# Patient Record
Sex: Female | Born: 1950
Health system: Southern US, Community
[De-identification: ages and names within clinical notes are randomized; demographics above are authoritative.]

## PROBLEM LIST (undated history)

## (undated) DIAGNOSIS — E119 Type 2 diabetes mellitus without complications: Secondary | ICD-10-CM

## (undated) DIAGNOSIS — F419 Anxiety disorder, unspecified: Secondary | ICD-10-CM

## (undated) DIAGNOSIS — M1990 Unspecified osteoarthritis, unspecified site: Secondary | ICD-10-CM

## (undated) HISTORY — PX: WISDOM TOOTH EXTRACTION: SHX21

## (undated) HISTORY — PX: CATARACT EXTRACTION: SUR2

---

## 2006-03-10 ENCOUNTER — Encounter: Admission: RE | Admit: 2006-03-10 | Discharge: 2006-03-10 | Payer: Self-pay | Admitting: Orthopedic Surgery

## 2009-10-31 ENCOUNTER — Encounter: Admission: RE | Admit: 2009-10-31 | Discharge: 2009-12-06 | Payer: Self-pay | Admitting: Orthopedic Surgery

## 2014-03-29 ENCOUNTER — Other Ambulatory Visit (HOSPITAL_COMMUNITY)
Admission: RE | Admit: 2014-03-29 | Discharge: 2014-03-29 | Disposition: A | Discharge status: Home / Self Care | Payer: 59 | Source: Ambulatory Visit | Attending: Family Medicine | Admitting: Family Medicine

## 2014-03-29 ENCOUNTER — Other Ambulatory Visit: Payer: Self-pay | Admitting: Family Medicine

## 2014-03-29 DIAGNOSIS — Z01419 Encounter for gynecological examination (general) (routine) without abnormal findings: Secondary | ICD-10-CM | POA: Insufficient documentation

## 2014-03-29 DIAGNOSIS — Z1151 Encounter for screening for human papillomavirus (HPV): Secondary | ICD-10-CM | POA: Diagnosis present

## 2014-03-30 LAB — CYTOLOGY - PAP

## 2014-06-21 ENCOUNTER — Encounter: Payer: Self-pay | Admitting: Family Medicine

## 2015-01-12 ENCOUNTER — Ambulatory Visit (INDEPENDENT_AMBULATORY_CARE_PROVIDER_SITE_OTHER): Payer: 59 | Admitting: Family Medicine

## 2015-01-12 ENCOUNTER — Ambulatory Visit (INDEPENDENT_AMBULATORY_CARE_PROVIDER_SITE_OTHER): Payer: 59

## 2015-01-12 VITALS — BP 137/78 | HR 95 | Temp 98.7°F | Resp 20 | Ht 63.5 in | Wt 219.4 lb

## 2015-01-12 DIAGNOSIS — R062 Wheezing: Secondary | ICD-10-CM | POA: Diagnosis not present

## 2015-01-12 DIAGNOSIS — R05 Cough: Secondary | ICD-10-CM

## 2015-01-12 DIAGNOSIS — F17200 Nicotine dependence, unspecified, uncomplicated: Secondary | ICD-10-CM

## 2015-01-12 DIAGNOSIS — R058 Other specified cough: Secondary | ICD-10-CM

## 2015-01-12 DIAGNOSIS — J209 Acute bronchitis, unspecified: Secondary | ICD-10-CM | POA: Diagnosis not present

## 2015-01-12 DIAGNOSIS — Z720 Tobacco use: Secondary | ICD-10-CM | POA: Diagnosis not present

## 2015-01-12 DIAGNOSIS — E669 Obesity, unspecified: Secondary | ICD-10-CM | POA: Insufficient documentation

## 2015-01-12 DIAGNOSIS — IMO0001 Reserved for inherently not codable concepts without codable children: Secondary | ICD-10-CM | POA: Insufficient documentation

## 2015-01-12 MED ORDER — IPRATROPIUM BROMIDE 0.02 % IN SOLN
0.5000 mg | Freq: Once | RESPIRATORY_TRACT | Status: AC
  Administered 2015-01-12: 0.5 mg via RESPIRATORY_TRACT

## 2015-01-12 MED ORDER — PREDNISONE 20 MG PO TABS: ORAL_TABLET | ORAL | Status: DC

## 2015-01-12 MED ORDER — DOXYCYCLINE HYCLATE 100 MG PO CAPS: 100.0000 mg | ORAL_CAPSULE | Freq: Two times a day (BID) | ORAL | Status: DC

## 2015-01-12 MED ORDER — ALBUTEROL SULFATE HFA 108 (90 BASE) MCG/ACT IN AERS: 2.0000 | INHALATION_SPRAY | Freq: Four times a day (QID) | RESPIRATORY_TRACT | Status: DC | PRN

## 2015-01-12 MED ORDER — ALBUTEROL SULFATE (2.5 MG/3ML) 0.083% IN NEBU
2.5000 mg | INHALATION_SOLUTION | Freq: Once | RESPIRATORY_TRACT | Status: AC
  Administered 2015-01-12: 2.5 mg via RESPIRATORY_TRACT

## 2015-01-12 NOTE — Progress Notes (Signed)
[facility] [address] [place] [address] [phone]  Date:  01/12/2015   Name:  Melissa Jimenez   DOB:  [birth year]   MRN:  [id number]  PCP:  No primary care provider on file.    Chief Complaint: Cough; Nasal Congestion; and Shortness of Breath   History of Present Illness:  Melissa Jimenez is a 64 y.o. very pleasant female patient who presents with the following:  She notes a "terrible cough, a lot of congestion."  She has had sx for about one week.  She is getting worse.   She gets out of breath if she tries to walk.  She normally does not have any problems with walking or other activities although she has smoked for a long time.   She has not noted a fever  She has noted some aches/ chills.    The cough can be productive She has noted some wheezing No GI symptoms Sh had noted a headache- this is better today She has tried various OTC medications   There are no active problems to display for this patient.   History reviewed. No pertinent past medical history.  History reviewed. No pertinent past surgical history.  Social History  Substance Use Topics  . Smoking status: Current Every Day Smoker -- 0.50 packs/day    Types: Cigarettes  . Smokeless tobacco: Never Used  . Alcohol Use: No    Family History  Problem Relation Age of Onset  . Cancer Mother   . Cancer Father   . Cancer Sister   . Hyperlipidemia Daughter     No Known Allergies  Medication list has been reviewed and updated.  No current outpatient prescriptions on file prior to visit.   No current facility-administered medications on file prior to visit.    Review of Systems:  As per HPI- otherwise negative.    Physical Examination: Filed Vitals:   01/12/15 2013  BP: 137/78  Pulse: 99  Temp: 98.7 F (37.1 C)  Resp: 20   Filed Vitals:   01/12/15 2013  Height: 5' 3.5" (1.613 m)  Weight: 219 lb 6 oz (99.508 kg)   Body mass index is 38.25  kg/(m^2). Ideal Body Weight: Weight in (lb) to have BMI = 25: 143.1  GEN: WDWN, NAD, Non-toxic, A & O x 3, obese HEENT: Atraumatic, Normocephalic. Neck supple. No masses, No LAD.  Bilateral TM wnl, oropharynx normal.  PEERL,EOMI.   Ears and Nose: No external deformity. CV: RRR, No M/G/R. No JVD. No thrill. No extra heart sounds. PULM:  No retractions. No resp. distress. No accessory muscle use. ABD: S, NT, ND, +BS. No rebound. No HSM. EXTR: No c/c/e NEURO Normal gait.  PSYCH: Normally interactive. Conversant. Not depressed or anxious appearing.  Calm demeanor.  She is quite tight, not moving much air at all and wheezing on initial lung exam.   Given duoneb: this did help her move more air and cleared wheezing  [facility] reading (PRIMARY) by  Dr. Lorelei Pont. CXR: no definite infiltrate   CHEST 2 VIEW  COMPARISON: None.  FINDINGS: Normal heart size and pulmonary vascularity. No focal airspace disease or consolidation in the lungs. No blunting of costophrenic angles. No pneumothorax. Mediastinal contours appear intact. Mild hyperinflation suggesting emphysema. Degenerative changes in the spine and shoulders. Calcification of the aorta. Old right rib fractures.  IMPRESSION: No active cardiopulmonary disease.  Assessment and Plan: Acute bronchitis, unspecified organism - Plan: predniSONE (DELTASONE) 20 MG tablet, doxycycline (VIBRAMYCIN)  100 MG capsule  Wheezing - Plan: albuterol (PROVENTIL) (2.5 MG/3ML) 0.083% nebulizer solution 2.5 mg, ipratropium (ATROVENT) nebulizer solution 0.5 mg, DG Chest 2 View, albuterol (PROVENTIL HFA;VENTOLIN HFA) 108 (90 BASE) MCG/ACT inhaler  Productive cough - Plan: DG Chest 2 View  Tobacco abuse  Treat today for bronchitis, ?COPD exacerbation and wheezing as above Encouraged her to come in for spirometry when she is well, and to quit smoking asap.  She will think about this Close follow-up if not better   Signed Lamar Blinks, MD

## 2015-01-12 NOTE — Patient Instructions (Signed)
We are going to treat you for bronchitis and wheezing.  Use the albuterol inhaler as needed for wheezing and cough Use the doxycycline antibiotic as directed Use the prednisone (steroid pills) as directed   You may have some COPD from smoking for a long time Please come and see [place] when you are well so we can do a breathing test for you  If your are not doing better over the next few days please let me know- Sooner if worse.   If you get acutely worse go to the ER Please think about quitting smoking now!!  Your lungs will likely get worse if you continue to smoke It would also be a very good idea to get a physical and labs soon- I am glad to see you for this if you like

## 2015-11-28 DIAGNOSIS — J209 Acute bronchitis, unspecified: Secondary | ICD-10-CM | POA: Diagnosis not present

## 2015-11-28 DIAGNOSIS — R05 Cough: Secondary | ICD-10-CM | POA: Diagnosis not present

## 2015-11-28 DIAGNOSIS — J9801 Acute bronchospasm: Secondary | ICD-10-CM | POA: Diagnosis not present

## 2015-11-28 MED FILL — VENTOLIN HFA 90 MCG INHALER: 108 (90 BAS | 16 days supply | Qty: 18 | Fill #0

## 2015-11-28 MED FILL — BENZONATATE 100 MG CAPSULE: 100 | 7 days supply | Qty: 21 | Fill #0

## 2015-11-28 MED FILL — AMOXICILLIN 875 MG TABLET: 875 | 10 days supply | Qty: 20 | Fill #0

## 2015-11-28 MED FILL — METHYLPREDNISOLONE 4 MG TAB: 4 | 6 days supply | Qty: 21 | Fill #0

## 2015-12-05 DIAGNOSIS — H04123 Dry eye syndrome of bilateral lacrimal glands: Secondary | ICD-10-CM | POA: Diagnosis not present

## 2015-12-05 DIAGNOSIS — H25043 Posterior subcapsular polar age-related cataract, bilateral: Secondary | ICD-10-CM | POA: Diagnosis not present

## 2015-12-05 MED FILL — LOTEMAX 0.5% GEL: 0.5 | 20 days supply | Qty: 5 | Fill #0

## 2015-12-22 DIAGNOSIS — H2513 Age-related nuclear cataract, bilateral: Secondary | ICD-10-CM | POA: Diagnosis not present

## 2015-12-22 DIAGNOSIS — H04123 Dry eye syndrome of bilateral lacrimal glands: Secondary | ICD-10-CM | POA: Diagnosis not present

## 2015-12-22 MED FILL — RESTASIS MULTIDOSE 0.05% EY: 0.05 | 27 days supply | Qty: 6 | Fill #0

## 2016-12-10 DIAGNOSIS — H2513 Age-related nuclear cataract, bilateral: Secondary | ICD-10-CM | POA: Diagnosis not present

## 2016-12-30 DIAGNOSIS — H31092 Other chorioretinal scars, left eye: Secondary | ICD-10-CM | POA: Diagnosis not present

## 2016-12-30 DIAGNOSIS — H2513 Age-related nuclear cataract, bilateral: Secondary | ICD-10-CM | POA: Diagnosis not present

## 2017-03-13 DIAGNOSIS — H2511 Age-related nuclear cataract, right eye: Secondary | ICD-10-CM | POA: Diagnosis not present

## 2017-03-13 DIAGNOSIS — H2512 Age-related nuclear cataract, left eye: Secondary | ICD-10-CM | POA: Diagnosis not present

## 2017-03-24 DIAGNOSIS — H2511 Age-related nuclear cataract, right eye: Secondary | ICD-10-CM | POA: Diagnosis not present

## 2017-03-27 DIAGNOSIS — H2511 Age-related nuclear cataract, right eye: Secondary | ICD-10-CM | POA: Diagnosis not present

## 2017-09-05 ENCOUNTER — Ambulatory Visit (INDEPENDENT_AMBULATORY_CARE_PROVIDER_SITE_OTHER): Payer: Self-pay | Admitting: Family Medicine

## 2017-09-05 VITALS — BP 126/90 | HR 88 | Temp 98.2°F | Resp 17 | Ht 63.5 in | Wt 222.0 lb

## 2017-09-05 DIAGNOSIS — Z0000 Encounter for general adult medical examination without abnormal findings: Secondary | ICD-10-CM

## 2017-09-05 DIAGNOSIS — R600 Localized edema: Secondary | ICD-10-CM

## 2017-09-05 NOTE — Patient Instructions (Signed)

## 2017-09-05 NOTE — Progress Notes (Signed)
Melissa Jimenez is a 67 y.o. female who presents today with concerns of need for an annual physical exam. She denies any chronic health conditions and is under the care of Dr. Justin Mend at [facility] for regular and routine health care.   Review of Systems  Constitutional: Negative for chills, fever and malaise/fatigue.  HENT: Negative for congestion, ear discharge, ear pain, sinus pain and sore throat.   Eyes: Negative.   Respiratory: Negative for cough, sputum production and shortness of breath.   Cardiovascular: Negative.  Negative for chest pain.  Gastrointestinal: Negative for abdominal pain, diarrhea, nausea and vomiting.  Genitourinary: Negative for dysuria, frequency, hematuria and urgency.  Musculoskeletal: Negative for myalgias.  Skin: Negative.   Neurological: Negative for headaches.  Endo/Heme/Allergies: Negative.   Psychiatric/Behavioral: Negative.     O: Vitals:   09/05/17 1024  BP: 126/90  Pulse: 88  Resp: 17  Temp: 98.2 F (36.8 C)  SpO2: 96%     Physical Exam  Constitutional: She is oriented to person, place, and time. Vital signs are normal. She appears well-developed and well-nourished. She is active.  Non-toxic appearance. She does not have a sickly appearance.  HENT:  Head: Normocephalic.  Right Ear: Hearing, tympanic membrane, external ear and ear canal normal.  Left Ear: Hearing, tympanic membrane, external ear and ear canal normal.  Nose: Nose normal.  Mouth/Throat: Uvula is midline and oropharynx is clear and moist.  Neck: Normal range of motion. Neck supple.  Cardiovascular: Normal rate, regular rhythm, normal heart sounds and normal pulses.  Pulmonary/Chest: Effort normal and breath sounds normal.  Abdominal: Soft. Bowel sounds are normal.  Musculoskeletal: Normal range of motion.       Right ankle: She exhibits swelling.       Left ankle: She exhibits swelling.  Bilateral lower extremity edema + 2 pitting- skin is intact and appropriate to exam   Lymphadenopathy:       Head (right side): No submental and no submandibular adenopathy present.       Head (left side): No submental and no submandibular adenopathy present.    She has no cervical adenopathy.  Neurological: She is alert and oriented to person, place, and time.  Psychiatric: She has a normal mood and affect. Her speech is normal and behavior is normal. Cognition and memory are normal.  PHQ-9- negative  Vitals reviewed.  A: 1. Physical exam    P: Exam findings, diagnosis etiology and medication use and indications reviewed with patient. Follow- Up and discharge instructions provided. No emergent/urgent issues found on exam.  Patient verbalized understanding of information provided and agrees with plan of care (POC), all questions answered.  1. Physical exam WNL- completed  2. Edema- discussed evening elevation, and ted hose- ultimately advised to f/u with [facility] for comprehensive evaluation

## 2019-11-10 ENCOUNTER — Other Ambulatory Visit (HOSPITAL_COMMUNITY): Payer: Self-pay | Admitting: Internal Medicine

## 2019-11-17 ENCOUNTER — Other Ambulatory Visit (HOSPITAL_COMMUNITY): Payer: Self-pay | Admitting: Internal Medicine

## 2019-11-17 DIAGNOSIS — Z720 Tobacco use: Secondary | ICD-10-CM | POA: Diagnosis not present

## 2019-11-17 DIAGNOSIS — E2839 Other primary ovarian failure: Secondary | ICD-10-CM | POA: Diagnosis not present

## 2019-11-17 DIAGNOSIS — Z0000 Encounter for general adult medical examination without abnormal findings: Secondary | ICD-10-CM | POA: Diagnosis not present

## 2019-11-17 DIAGNOSIS — Z13220 Encounter for screening for lipoid disorders: Secondary | ICD-10-CM | POA: Diagnosis not present

## 2019-11-17 DIAGNOSIS — Z1211 Encounter for screening for malignant neoplasm of colon: Secondary | ICD-10-CM | POA: Diagnosis not present

## 2019-11-19 ENCOUNTER — Other Ambulatory Visit: Payer: Self-pay | Admitting: Family Medicine

## 2019-11-19 DIAGNOSIS — E2839 Other primary ovarian failure: Secondary | ICD-10-CM

## 2019-11-19 DIAGNOSIS — Z1231 Encounter for screening mammogram for malignant neoplasm of breast: Secondary | ICD-10-CM

## 2019-11-22 ENCOUNTER — Ambulatory Visit
Admission: RE | Admit: 2019-11-22 | Discharge: 2019-11-22 | Disposition: A | Discharge status: Home / Self Care | Payer: 59 | Source: Ambulatory Visit | Attending: Family Medicine | Admitting: Family Medicine

## 2019-11-22 ENCOUNTER — Other Ambulatory Visit: Payer: Self-pay

## 2019-11-22 DIAGNOSIS — Z1231 Encounter for screening mammogram for malignant neoplasm of breast: Secondary | ICD-10-CM | POA: Diagnosis not present

## 2019-11-24 ENCOUNTER — Ambulatory Visit
Admission: RE | Admit: 2019-11-24 | Discharge: 2019-11-24 | Disposition: A | Discharge status: Home / Self Care | Payer: 59 | Source: Ambulatory Visit | Attending: Family Medicine | Admitting: Family Medicine

## 2019-11-24 ENCOUNTER — Other Ambulatory Visit: Payer: Self-pay

## 2019-11-24 DIAGNOSIS — M85852 Other specified disorders of bone density and structure, left thigh: Secondary | ICD-10-CM | POA: Diagnosis not present

## 2019-11-24 DIAGNOSIS — Z780 Asymptomatic menopausal state: Secondary | ICD-10-CM | POA: Diagnosis not present

## 2019-11-24 DIAGNOSIS — Z1211 Encounter for screening for malignant neoplasm of colon: Secondary | ICD-10-CM | POA: Diagnosis not present

## 2019-11-24 DIAGNOSIS — E2839 Other primary ovarian failure: Secondary | ICD-10-CM

## 2019-11-26 ENCOUNTER — Other Ambulatory Visit (HOSPITAL_COMMUNITY): Payer: Self-pay | Admitting: Family Medicine

## 2019-11-26 MED FILL — ROSUVASTATIN CALCIUM 5 MG T: 5 | 90 days supply | Qty: 90 | Fill #0

## 2019-11-26 MED FILL — SHINGRIX 50 MCG SUS: 50 | 1 days supply | Qty: 1 | Fill #0

## 2020-01-07 DIAGNOSIS — E782 Mixed hyperlipidemia: Secondary | ICD-10-CM | POA: Diagnosis not present

## 2020-01-07 DIAGNOSIS — Z131 Encounter for screening for diabetes mellitus: Secondary | ICD-10-CM | POA: Diagnosis not present

## 2020-01-17 ENCOUNTER — Other Ambulatory Visit (HOSPITAL_COMMUNITY): Payer: Self-pay | Admitting: Family Medicine

## 2020-01-17 DIAGNOSIS — Z7984 Long term (current) use of oral hypoglycemic drugs: Secondary | ICD-10-CM | POA: Diagnosis not present

## 2020-01-17 DIAGNOSIS — E119 Type 2 diabetes mellitus without complications: Secondary | ICD-10-CM | POA: Diagnosis not present

## 2020-01-17 MED FILL — metFORMIN HCL ER 500 MG TB2: 500 | 90 days supply | Qty: 180 | Fill #0

## 2020-01-17 MED FILL — FREESTYLE LITE METER: W/DEVICE | 20 days supply | Qty: 1 | Fill #0

## 2020-01-17 MED FILL — FREESTYLE LANCETS: 90 days supply | Qty: 200 | Fill #0

## 2020-01-17 MED FILL — FREESTYLE LITE TEST STRIP: 66 days supply | Qty: 200 | Fill #0

## 2020-01-21 MED FILL — SHINGRIX 50 MCG SUS: 50 | 1 days supply | Qty: 1 | Fill #1

## 2020-02-07 MED FILL — PNEUMOVAX 23 SYRINGE: 25 | 1 days supply | Qty: 1 | Fill #0

## 2020-02-28 MED FILL — ROSUVASTATIN CALCIUM 5 MG T: 5 | 90 days supply | Qty: 90 | Fill #1

## 2020-04-20 MED FILL — metFORMIN HCL ER 500 MG TB2: 500 | 90 days supply | Qty: 180 | Fill #1

## 2020-05-16 ENCOUNTER — Other Ambulatory Visit (HOSPITAL_COMMUNITY): Payer: Self-pay | Admitting: Family Medicine

## 2020-05-16 DIAGNOSIS — B351 Tinea unguium: Secondary | ICD-10-CM | POA: Diagnosis not present

## 2020-05-16 DIAGNOSIS — E119 Type 2 diabetes mellitus without complications: Secondary | ICD-10-CM | POA: Diagnosis not present

## 2020-05-16 DIAGNOSIS — Z720 Tobacco use: Secondary | ICD-10-CM | POA: Diagnosis not present

## 2020-05-16 DIAGNOSIS — E782 Mixed hyperlipidemia: Secondary | ICD-10-CM | POA: Diagnosis not present

## 2020-05-16 MED FILL — ROSUVASTATIN CALCIUM 5 MG T: 5 | 90 days supply | Qty: 90 | Fill #0

## 2020-05-25 ENCOUNTER — Ambulatory Visit: Payer: PPO | Admitting: Podiatry

## 2020-05-25 ENCOUNTER — Other Ambulatory Visit: Payer: Self-pay

## 2020-05-25 ENCOUNTER — Other Ambulatory Visit: Payer: Self-pay | Admitting: Podiatry

## 2020-05-25 DIAGNOSIS — B351 Tinea unguium: Secondary | ICD-10-CM

## 2020-05-25 MED ORDER — TERBINAFINE HCL 250 MG PO TABS: 250.0000 mg | ORAL_TABLET | Freq: Every day | ORAL | 0 refills | Status: DC

## 2020-05-25 MED ORDER — CICLOPIROX 8 % EX SOLN: Freq: Every day | CUTANEOUS | 3 refills | Status: DC

## 2020-05-25 NOTE — Patient Instructions (Signed)
Fungal Nail Infection A fungal nail infection is a common infection of the toenails or fingernails. This condition affects toenails more often than fingernails. It often affects the great, or big, toes. More than one nail may be infected. The condition can be passed from person to person (is contagious). What are the causes? This condition is caused by a fungus. Several types of fungi can cause the infection. These fungi are common in moist and warm areas. If your hands or feet come into contact with the fungus, it may get into a crack in your fingernail or toenail and cause the infection. What increases the risk? The following factors may make you more likely to develop this condition:  Being female.  Being of older age.  Living with someone who has the fungus.  Walking barefoot in areas where the fungus thrives, such as showers or locker rooms.  Wearing shoes and socks that cause your feet to sweat.  Having a nail injury or a recent nail surgery.  Having certain medical conditions, such as: ? Athlete's foot. ? Diabetes. ? Psoriasis. ? Poor circulation. ? A weak body defense system (immune system). What are the signs or symptoms? Symptoms of this condition include:  A pale spot on the nail.  Thickening of the nail.  A nail that becomes yellow or brown.  A brittle or ragged nail edge.  A crumbling nail.  A nail that has lifted away from the nail bed.   How is this diagnosed? This condition is diagnosed with a physical exam. Your health care provider may take a scraping or clipping from your nail to test for the fungus. How is this treated? Treatment is not needed for mild infections. If you have significant nail changes, treatment may include:  Antifungal medicines taken by mouth (orally). You may need to take the medicine for several weeks or several months, and you may not see the results for a long time. These medicines can cause side effects. Ask your health care  provider what problems to watch for.  Antifungal nail polish or nail cream. These may be used along with oral antifungal medicines.  Laser treatment of the nail.  Surgery to remove the nail. This may be needed for the most severe infections. It can take a long time, usually up to a year, for the infection to go away. The infection may also come back.   Follow these instructions at home: Medicines  Take or apply over-the-counter and prescription medicines only as told by your health care provider.  Ask your health care provider about using over-the-counter mentholated ointment on your nails. Nail care  Trim your nails often.  Wash and dry your hands and feet every day.  Keep your feet dry: ? Wear absorbent socks, and change your socks frequently. ? Wear shoes that allow air to circulate, such as sandals or canvas tennis shoes. Throw out old shoes.  Do not use artificial nails.  If you go to a nail salon, make sure you choose one that uses clean instruments.  Use antifungal foot powder on your feet and in your shoes. General instructions  Do not share personal items, such as towels or nail clippers.  Do not walk barefoot in shower rooms or locker rooms.  Wear rubber gloves if you are working with your hands in wet areas.  Keep all follow-up visits as told by your health care provider. This is important. Contact a health care provider if: Your infection is not getting better or   it is getting worse after several months. Summary  A fungal nail infection is a common infection of the toenails or fingernails.  Treatment is not needed for mild infections. If you have significant nail changes, treatment may include taking medicine orally and applying medicine to your nails.  It can take a long time, usually up to a year, for the infection to go away. The infection may also come back.  Take or apply over-the-counter and prescription medicines only as told by your health care  provider.  Follow instructions for taking care of your nails to help prevent infection from coming back or spreading. This information is not intended to replace advice given to you by your health care provider. Make sure you discuss any questions you have with your health care provider. Document Revised: 06/18/2018 Document Reviewed: 08/01/2017 Elsevier Patient Education  2021 Elsevier Inc.  

## 2020-05-25 NOTE — Progress Notes (Signed)
  Subjective:  Patient ID: Melissa Jimenez, female    DOB: 1950-11-19,  MRN: [id number]  Chief Complaint  Patient presents with  . Nail Problem    Nail discoloration  PT stated that she has tried different medications     70 y.o. female presents with the above complaint. History confirmed with patient.  She has tried multiple over-the-counter treatments before without success  Objective:  Physical Exam:  warm, good capillary refill, no trophic changes or ulcerative lesions, normal DP and PT pulses and normal sensory exam.  Onychomycosis of all nails with yellow discoloration, thickening the nail plates and dystrophic subungual debris     Assessment:   1. Onychomycosis      Plan:  Patient was evaluated and treated and all questions answered.  Discussed the etiology and treatment options for the condition in detail with the patient. Educated patient on the topical and oral treatment options for mycotic nails. Recommended topical and oral treatment combination therapy with Lamisil and ciclopirox.  She has no history of liver disease and does not drink heavily.  Follow-up in 4 months to reevaluate.  Photographs were taken.  We will consider laser therapy in the future if not improving  Return in about 4 months (around 09/24/2020) for follow up on nail fungus.

## 2020-05-29 ENCOUNTER — Other Ambulatory Visit (HOSPITAL_COMMUNITY): Payer: Self-pay | Admitting: Family Medicine

## 2020-05-29 MED FILL — OZEMPIC 0.25 OR 0.5 MG/DOSE: 2 | 70 days supply | Qty: 3 | Fill #0

## 2020-06-26 ENCOUNTER — Other Ambulatory Visit (HOSPITAL_COMMUNITY): Payer: Self-pay

## 2020-06-26 MED FILL — Ciclopirox Solution 8%: CUTANEOUS | 30 days supply | Qty: 6.6 | Fill #0 | Status: AC

## 2020-07-24 ENCOUNTER — Other Ambulatory Visit (HOSPITAL_COMMUNITY): Payer: Self-pay

## 2020-07-24 MED ORDER — METFORMIN HCL ER 500 MG PO TB24
ORAL_TABLET | ORAL | 1 refills | Status: DC
  Filled 2020-07-24: qty 180, 90d supply, fill #0
  Filled 2020-10-21: qty 180, 90d supply, fill #1

## 2020-08-09 ENCOUNTER — Other Ambulatory Visit (HOSPITAL_COMMUNITY): Payer: Self-pay

## 2020-08-09 MED FILL — Ciclopirox Solution 8%: CUTANEOUS | 30 days supply | Qty: 6.6 | Fill #1 | Status: AC

## 2020-08-26 ENCOUNTER — Other Ambulatory Visit (HOSPITAL_COMMUNITY): Payer: Self-pay

## 2020-08-26 MED FILL — Rosuvastatin Calcium Tab 5 MG: ORAL | 90 days supply | Qty: 90 | Fill #0 | Status: AC

## 2020-08-29 DIAGNOSIS — E119 Type 2 diabetes mellitus without complications: Secondary | ICD-10-CM | POA: Diagnosis not present

## 2020-09-19 ENCOUNTER — Other Ambulatory Visit (HOSPITAL_COMMUNITY): Payer: Self-pay

## 2020-09-19 MED FILL — Ciclopirox Solution 8%: CUTANEOUS | 30 days supply | Qty: 6.6 | Fill #2 | Status: AC

## 2020-09-20 ENCOUNTER — Other Ambulatory Visit (HOSPITAL_COMMUNITY): Payer: Self-pay

## 2020-09-25 ENCOUNTER — Other Ambulatory Visit (HOSPITAL_COMMUNITY): Payer: Self-pay

## 2020-09-28 ENCOUNTER — Ambulatory Visit: Payer: PPO | Admitting: Podiatry

## 2020-10-05 ENCOUNTER — Ambulatory Visit: Payer: PPO | Admitting: Podiatry

## 2020-10-05 ENCOUNTER — Other Ambulatory Visit (HOSPITAL_COMMUNITY): Payer: Self-pay

## 2020-10-05 ENCOUNTER — Other Ambulatory Visit: Payer: Self-pay

## 2020-10-05 DIAGNOSIS — B351 Tinea unguium: Secondary | ICD-10-CM | POA: Diagnosis not present

## 2020-10-05 MED ORDER — FLUCONAZOLE 150 MG PO TABS
150.0000 mg | ORAL_TABLET | Freq: Once | ORAL | 0 refills | Status: DC
  Filled 2020-10-05: qty 1, 1d supply, fill #0

## 2020-10-08 ENCOUNTER — Encounter: Payer: Self-pay | Admitting: Podiatry

## 2020-10-08 MED ORDER — FLUCONAZOLE 150 MG PO TABS
150.0000 mg | ORAL_TABLET | ORAL | 0 refills | Status: DC
  Filled 2020-10-08: qty 12, 84d supply, fill #0
  Filled 2021-01-03: qty 12, 84d supply, fill #1
  Filled 2021-04-05 – 2021-04-06 (×2): qty 2, 14d supply, fill #2

## 2020-10-08 NOTE — Progress Notes (Signed)
  Subjective:  Patient ID: Melissa Jimenez, female    DOB: [birth year],  MRN: [id number]  Chief Complaint  Patient presents with   Nail Problem      follow up on nail fungus    70 y.o. female presents with the above complaint. History confirmed with patient.  She feels like she has had some improvement  Objective:  Physical Exam:  warm, good capillary refill, no trophic changes or ulcerative lesions, normal DP and PT pulses and normal sensory exam.  Onychomycosis of all nails with yellow discoloration, thickening the nail plates and dystrophic subungual debris     Assessment:   1. Onychomycosis       Plan:  Patient was evaluated and treated and all questions answered.  She did have slight improvement with the Lamisil although it was not a huge difference.  We discussed further treatment with pulsed dosing of fluconazole.  76-monthcourse sent to her pharmacy.  She will follow-up with me at that time we will reevaluate.  If not significantly improved by that point likely will not have a good option for a mycotic cure  Return in about 6 months (around 04/07/2021).

## 2020-10-09 ENCOUNTER — Other Ambulatory Visit (HOSPITAL_COMMUNITY): Payer: Self-pay

## 2020-10-21 ENCOUNTER — Other Ambulatory Visit (HOSPITAL_COMMUNITY): Payer: Self-pay

## 2020-10-31 DIAGNOSIS — E119 Type 2 diabetes mellitus without complications: Secondary | ICD-10-CM | POA: Diagnosis not present

## 2020-11-14 ENCOUNTER — Other Ambulatory Visit: Payer: Self-pay | Admitting: Family Medicine

## 2020-11-14 DIAGNOSIS — Z1231 Encounter for screening mammogram for malignant neoplasm of breast: Secondary | ICD-10-CM

## 2020-11-15 DIAGNOSIS — E119 Type 2 diabetes mellitus without complications: Secondary | ICD-10-CM | POA: Diagnosis not present

## 2020-11-15 DIAGNOSIS — E782 Mixed hyperlipidemia: Secondary | ICD-10-CM | POA: Diagnosis not present

## 2020-11-17 DIAGNOSIS — Z1211 Encounter for screening for malignant neoplasm of colon: Secondary | ICD-10-CM | POA: Diagnosis not present

## 2020-11-17 DIAGNOSIS — Z23 Encounter for immunization: Secondary | ICD-10-CM | POA: Diagnosis not present

## 2020-11-17 DIAGNOSIS — Z720 Tobacco use: Secondary | ICD-10-CM | POA: Diagnosis not present

## 2020-11-17 DIAGNOSIS — E782 Mixed hyperlipidemia: Secondary | ICD-10-CM | POA: Diagnosis not present

## 2020-11-17 DIAGNOSIS — E119 Type 2 diabetes mellitus without complications: Secondary | ICD-10-CM | POA: Diagnosis not present

## 2020-11-17 DIAGNOSIS — M25511 Pain in right shoulder: Secondary | ICD-10-CM | POA: Diagnosis not present

## 2020-11-17 DIAGNOSIS — Z0000 Encounter for general adult medical examination without abnormal findings: Secondary | ICD-10-CM | POA: Diagnosis not present

## 2020-11-17 DIAGNOSIS — M25512 Pain in left shoulder: Secondary | ICD-10-CM | POA: Diagnosis not present

## 2020-11-21 DIAGNOSIS — M19011 Primary osteoarthritis, right shoulder: Secondary | ICD-10-CM | POA: Diagnosis not present

## 2020-11-21 DIAGNOSIS — M19012 Primary osteoarthritis, left shoulder: Secondary | ICD-10-CM | POA: Diagnosis not present

## 2020-11-21 DIAGNOSIS — M5030 Other cervical disc degeneration, unspecified cervical region: Secondary | ICD-10-CM | POA: Diagnosis not present

## 2020-11-23 DIAGNOSIS — Z1211 Encounter for screening for malignant neoplasm of colon: Secondary | ICD-10-CM | POA: Diagnosis not present

## 2020-11-24 ENCOUNTER — Other Ambulatory Visit (HOSPITAL_COMMUNITY): Payer: Self-pay

## 2020-11-27 ENCOUNTER — Other Ambulatory Visit (HOSPITAL_COMMUNITY): Payer: Self-pay

## 2020-11-27 MED ORDER — ROSUVASTATIN CALCIUM 5 MG PO TABS
ORAL_TABLET | ORAL | 1 refills | Status: DC
  Filled 2020-11-27: qty 90, 90d supply, fill #0
  Filled 2021-02-23: qty 90, 90d supply, fill #1

## 2020-11-28 ENCOUNTER — Other Ambulatory Visit (HOSPITAL_COMMUNITY): Payer: Self-pay

## 2020-11-28 DIAGNOSIS — M19012 Primary osteoarthritis, left shoulder: Secondary | ICD-10-CM | POA: Diagnosis not present

## 2020-12-19 DIAGNOSIS — M19012 Primary osteoarthritis, left shoulder: Secondary | ICD-10-CM | POA: Diagnosis not present

## 2020-12-21 ENCOUNTER — Ambulatory Visit: Payer: 59

## 2021-01-03 ENCOUNTER — Other Ambulatory Visit (HOSPITAL_COMMUNITY): Payer: Self-pay

## 2021-01-03 MED FILL — Lancets: 90 days supply | Qty: 200 | Fill #0 | Status: AC

## 2021-01-03 MED FILL — Glucose Blood Test Strip: 67 days supply | Qty: 200 | Fill #0 | Status: AC

## 2021-01-18 ENCOUNTER — Other Ambulatory Visit (HOSPITAL_COMMUNITY): Payer: Self-pay

## 2021-01-19 ENCOUNTER — Other Ambulatory Visit (HOSPITAL_COMMUNITY): Payer: Self-pay

## 2021-01-19 MED ORDER — METFORMIN HCL ER 500 MG PO TB24
ORAL_TABLET | ORAL | 3 refills | Status: DC
  Filled 2021-01-19: qty 180, 90d supply, fill #0
  Filled 2021-04-18: qty 180, 90d supply, fill #1
  Filled 2021-07-19: qty 180, 90d supply, fill #2
  Filled 2021-10-16: qty 180, 90d supply, fill #3

## 2021-01-22 ENCOUNTER — Other Ambulatory Visit (HOSPITAL_COMMUNITY): Payer: Self-pay

## 2021-01-23 ENCOUNTER — Ambulatory Visit
Admission: RE | Admit: 2021-01-23 | Discharge: 2021-01-23 | Disposition: A | Discharge status: Home / Self Care | Payer: PPO | Source: Ambulatory Visit | Attending: Family Medicine | Admitting: Family Medicine

## 2021-01-23 ENCOUNTER — Other Ambulatory Visit: Payer: Self-pay

## 2021-01-23 DIAGNOSIS — Z1231 Encounter for screening mammogram for malignant neoplasm of breast: Secondary | ICD-10-CM | POA: Diagnosis not present

## 2021-02-23 ENCOUNTER — Other Ambulatory Visit (HOSPITAL_COMMUNITY): Payer: Self-pay

## 2021-04-05 ENCOUNTER — Other Ambulatory Visit: Payer: Self-pay | Admitting: Podiatry

## 2021-04-05 ENCOUNTER — Other Ambulatory Visit (HOSPITAL_COMMUNITY): Payer: Self-pay

## 2021-04-06 ENCOUNTER — Other Ambulatory Visit (HOSPITAL_COMMUNITY): Payer: Self-pay

## 2021-04-18 ENCOUNTER — Other Ambulatory Visit (HOSPITAL_COMMUNITY): Payer: Self-pay

## 2021-04-18 MED ORDER — AMOXICILLIN 500 MG PO CAPS
ORAL_CAPSULE | ORAL | 0 refills | Status: DC
  Filled 2021-04-18: qty 21, 7d supply, fill #0

## 2021-05-28 ENCOUNTER — Other Ambulatory Visit (HOSPITAL_COMMUNITY): Payer: Self-pay

## 2021-05-28 MED ORDER — ROSUVASTATIN CALCIUM 5 MG PO TABS
ORAL_TABLET | ORAL | 1 refills | Status: DC
  Filled 2021-05-28: qty 90, 90d supply, fill #0
  Filled 2021-08-27: qty 90, 90d supply, fill #1

## 2021-05-31 ENCOUNTER — Other Ambulatory Visit (HOSPITAL_COMMUNITY): Payer: Self-pay

## 2021-05-31 DIAGNOSIS — E782 Mixed hyperlipidemia: Secondary | ICD-10-CM | POA: Diagnosis not present

## 2021-05-31 DIAGNOSIS — E119 Type 2 diabetes mellitus without complications: Secondary | ICD-10-CM | POA: Diagnosis not present

## 2021-05-31 DIAGNOSIS — Z720 Tobacco use: Secondary | ICD-10-CM | POA: Diagnosis not present

## 2021-05-31 DIAGNOSIS — R030 Elevated blood-pressure reading, without diagnosis of hypertension: Secondary | ICD-10-CM | POA: Diagnosis not present

## 2021-05-31 MED ORDER — GLUCOSE BLOOD VI STRP
ORAL_STRIP | 3 refills | Status: DC
  Filled 2021-05-31: qty 200, 66d supply, fill #0

## 2021-05-31 MED ORDER — ROSUVASTATIN CALCIUM 5 MG PO TABS
ORAL_TABLET | ORAL | 1 refills | Status: DC
  Filled 2021-05-31: qty 90, 90d supply, fill #0

## 2021-05-31 MED ORDER — ACCU-CHEK FASTCLIX LANCETS MISC
3 refills | Status: DC
  Filled 2021-05-31: qty 204, 90d supply, fill #0

## 2021-05-31 MED ORDER — BUPROPION HCL ER (XL) 150 MG PO TB24
ORAL_TABLET | ORAL | 0 refills | Status: DC
  Filled 2021-05-31 (×2): qty 90, 90d supply, fill #0

## 2021-06-01 ENCOUNTER — Other Ambulatory Visit (HOSPITAL_COMMUNITY): Payer: Self-pay

## 2021-06-04 ENCOUNTER — Other Ambulatory Visit (HOSPITAL_COMMUNITY): Payer: Self-pay

## 2021-06-05 ENCOUNTER — Other Ambulatory Visit (HOSPITAL_COMMUNITY): Payer: Self-pay

## 2021-06-05 MED ORDER — ONETOUCH DELICA PLUS LANCET33G MISC
3 refills | Status: DC
  Filled 2021-06-05: qty 100, 50d supply, fill #0

## 2021-06-05 MED ORDER — TRULICITY 0.75 MG/0.5ML ~~LOC~~ SOAJ
SUBCUTANEOUS | 0 refills | Status: DC
  Filled 2021-06-05: qty 2, 28d supply, fill #0

## 2021-06-05 MED ORDER — GLUCOSE BLOOD VI STRP
ORAL_STRIP | 3 refills | Status: DC
  Filled 2021-06-05: qty 100, 50d supply, fill #0

## 2021-06-06 ENCOUNTER — Other Ambulatory Visit (HOSPITAL_COMMUNITY): Payer: Self-pay

## 2021-06-09 ENCOUNTER — Other Ambulatory Visit (HOSPITAL_COMMUNITY): Payer: Self-pay

## 2021-06-11 ENCOUNTER — Other Ambulatory Visit (HOSPITAL_COMMUNITY): Payer: Self-pay

## 2021-06-11 MED ORDER — ONETOUCH VERIO FLEX SYSTEM W/DEVICE KIT
PACK | 0 refills | Status: AC
  Filled 2021-06-11: qty 1, 30d supply, fill #0

## 2021-06-12 ENCOUNTER — Other Ambulatory Visit (HOSPITAL_COMMUNITY): Payer: Self-pay

## 2021-07-19 ENCOUNTER — Other Ambulatory Visit (HOSPITAL_COMMUNITY): Payer: Self-pay

## 2021-07-23 ENCOUNTER — Other Ambulatory Visit: Payer: Self-pay | Admitting: *Deleted

## 2021-07-23 DIAGNOSIS — Z87891 Personal history of nicotine dependence: Secondary | ICD-10-CM

## 2021-07-23 DIAGNOSIS — Z122 Encounter for screening for malignant neoplasm of respiratory organs: Secondary | ICD-10-CM

## 2021-07-23 DIAGNOSIS — F17210 Nicotine dependence, cigarettes, uncomplicated: Secondary | ICD-10-CM

## 2021-08-14 ENCOUNTER — Ambulatory Visit (INDEPENDENT_AMBULATORY_CARE_PROVIDER_SITE_OTHER): Payer: PPO | Admitting: Acute Care

## 2021-08-14 ENCOUNTER — Encounter: Payer: Self-pay | Admitting: Acute Care

## 2021-08-14 DIAGNOSIS — F17210 Nicotine dependence, cigarettes, uncomplicated: Secondary | ICD-10-CM | POA: Diagnosis not present

## 2021-08-14 NOTE — Patient Instructions (Signed)
Thank you for participating in the [facility] Scre[facility]. It was our pleasure to meet you today. We will call you with the results of your scan within the next few days. Your scan will be assigned a Lung RADS category score by the physicians reading the scans.  This Lung RADS score determines follow up scanning.  See below for description of categories, and follow up screening recommendations. We will be in touch to schedule your follow up screening annually or based on recommendations of our providers. We will fax a copy of your scan results to your Primary Care Physician, or the physician who referred you to the program, to ensure they have the results. Please call the office if you have any questions or concerns regarding your scanning experience or results.  Our office number is [phone]. Please speak with Denise Phelps, RN. , or  Denise Buckner RN, They are  our Lung Cancer Screening RN.'s If They are unavailable when you call, Please leave a message on the voice mail. We will return your call at our earliest convenience.This voice mail is monitored several times a day.  Remember, if your scan is normal, we will scan you annually as long as you continue to meet the criteria for the program. (Age 55-77, Current smoker or smoker who has quit within the last 15 years). If you are a smoker, remember, quitting is the single most powerful action that you can take to decrease your risk of lung cancer and other pulmonary, breathing related problems. We know quitting is hard, and we are here to help.  Please let us know if there is anything we can do to help you meet your goal of quitting. If you are a former smoker, congratulations. We are proud of you! Remain smoke free! Remember you can refer friends or family members through the number above.  We will screen them to make sure they meet criteria for the program. Thank you for helping us take better care of you by  participating in Lung Screening.  You can receive free nicotine replacement therapy ( patches, gum or mints) by calling 1-800-QUIT NOW. Please call so we can get you on the path to becoming  a non-smoker. I know it is hard, but you can do this!  Lung RADS Categories:  Lung RADS 1: no nodules or definitely non-concerning nodules.  Recommendation is for a repeat annual scan in 12 months.  Lung RADS 2:  nodules that are non-concerning in appearance and behavior with a very low likelihood of becoming an active cancer. Recommendation is for a repeat annual scan in 12 months.  Lung RADS 3: nodules that are probably non-concerning , includes nodules with a low likelihood of becoming an active cancer.  Recommendation is for a 6-month repeat screening scan. Often noted after an upper respiratory illness. We will be in touch to make sure you have no questions, and to schedule your 6-month scan.  Lung RADS 4 A: nodules with concerning findings, recommendation is most often for a follow up scan in 3 months or additional testing based on our provider's assessment of the scan. We will be in touch to make sure you have no questions and to schedule the recommended 3 month follow up scan.  Lung RADS 4 B:  indicates findings that are concerning. We will be in touch with you to schedule additional diagnostic testing based on our provider's  assessment of the scan.  Other options for assistance in smoking cessation (   As covered by your insurance benefits)  Hypnosis for smoking cessation  [employer] [phone]  Acupuncture for smoking cessation  [facility] [phone]   

## 2021-08-14 NOTE — Progress Notes (Signed)
Virtual Visit via Telephone Note  I connected with Melissa Jimenez on 08/14/21 at  3:30 PM EDT by telephone and verified that I am speaking with the correct person using two identifiers.  Location: Patient:  At home Provider:  [address], [place], [place], [address]    I discussed the limitations, risks, security and privacy concerns of performing an evaluation and management service by telephone and the availability of in person appointments. I also discussed with the patient that there may be a patient responsible charge related to this service. The patient expressed understanding and agreed to proceed.   Shared Decision Making Visit Lung Cancer Screening Program [phone])   Eligibility: Age 71 y.o. Pack Years Smoking History Calculation 47 pack year smoking history (# packs/per year x # years smoked) Recent History of coughing up blood  no Unexplained weight loss? no ( >Than 15 pounds within the last 6 months ) Prior History Lung / other cancer no (Diagnosis within the last 5 years already requiring surveillance chest CT Scans). Smoking Status Current Smoker Former Smokers: Years since quit:  NA  Quit Date:  NA  Visit Components: Discussion included one or more decision making aids. yes Discussion included risk/benefits of screening. yes Discussion included potential follow up diagnostic testing for abnormal scans. yes Discussion included meaning and risk of over diagnosis. yes Discussion included meaning and risk of False Positives. yes Discussion included meaning of total radiation exposure. yes  Counseling Included: Importance of adherence to annual lung cancer LDCT screening. yes Impact of comorbidities on ability to participate in the program. yes Ability and willingness to under diagnostic treatment. yes  Smoking Cessation Counseling: Current Smokers:  Discussed importance of smoking cessation. yes Information about tobacco cessation classes and  interventions provided to patient. yes Patient provided with "ticket" for LDCT Scan. yes Symptomatic Patient. no  Counseling NA Diagnosis Code: Tobacco Use Z72.0 Asymptomatic Patient yes  Counseling (Intermediate counseling: > three minutes counseling) K0938 Former Smokers:  Discussed the importance of maintaining cigarette abstinence. yes Diagnosis Code: Personal History of Nicotine Dependence. H82.993 Information about tobacco cessation classes and interventions provided to patient. Yes Patient provided with "ticket" for LDCT Scan. yes Written Order for Lung Cancer Screening with LDCT placed in Epic. Yes (CT Chest Lung Cancer Screening Low Dose W/O CM) [id number] Z12.2-Screening of respiratory organs Z87.891-Personal history of nicotine dependence  I have spent 25 minutes of face to face/ virtual visit   time with  Melissa Jimenez discussing the risks and benefits of lung cancer screening. We viewed / discussed a power point together that explained in detail the above noted topics. We paused at intervals to allow for questions to be asked and answered to ensure understanding.We discussed that the single most powerful action that she can take to decrease her risk of developing lung cancer is to quit smoking. We discussed whether or not she is ready to commit to setting a quit date. We discussed options for tools to aid in quitting smoking including nicotine replacement therapy, non-nicotine medications, support groups, Quit Smart classes, and behavior modification. We discussed that often times setting smaller, more achievable goals, such as eliminating 1 cigarette a day for a week and then 2 cigarettes a day for a week can be helpful in slowly decreasing the number of cigarettes smoked. This allows for a sense of accomplishment as well as providing a clinical benefit. I provided  her  with smoking cessation  information  with contact information for community resources,  classes, free nicotine replacement  therapy, and access to mobile apps, text messaging, and on-line smoking cessation help. I have also provided  her  the office contact information in the event she needs to contact me, or the screening staff. We discussed the time and location of the scan, and that either Doroteo Glassman RN, Joella Prince, RN  or I will call / send a letter with the results within 24-72 hours of receiving them. The patient verbalized understanding of all of  the above and had no further questions upon leaving the office. They have my contact information in the event they have any further questions.  I spent 3 minutes counseling on smoking cessation and the health risks of continued tobacco abuse.  I explained to the patient that there has been a high incidence of coronary artery disease noted on these exams. I explained that this is a non-gated exam therefore degree or severity cannot be determined. This patient is on statin therapy. I have asked the patient to follow-up with their PCP regarding any incidental finding of coronary artery disease and management with diet or medication as their PCP  feels is clinically indicated. The patient verbalized understanding of the above and had no further questions upon completion of the visit.      Magdalen Spatz, NP 08/14/2021

## 2021-08-15 ENCOUNTER — Ambulatory Visit (INDEPENDENT_AMBULATORY_CARE_PROVIDER_SITE_OTHER)
Admission: RE | Admit: 2021-08-15 | Discharge: 2021-08-15 | Disposition: A | Discharge status: Home / Self Care | Payer: PPO | Source: Ambulatory Visit | Attending: Acute Care | Admitting: Acute Care

## 2021-08-15 DIAGNOSIS — F17210 Nicotine dependence, cigarettes, uncomplicated: Secondary | ICD-10-CM | POA: Diagnosis not present

## 2021-08-15 DIAGNOSIS — Z122 Encounter for screening for malignant neoplasm of respiratory organs: Secondary | ICD-10-CM | POA: Diagnosis not present

## 2021-08-15 DIAGNOSIS — Z87891 Personal history of nicotine dependence: Secondary | ICD-10-CM | POA: Diagnosis not present

## 2021-08-17 ENCOUNTER — Telehealth: Payer: Self-pay | Admitting: Acute Care

## 2021-08-17 DIAGNOSIS — F17210 Nicotine dependence, cigarettes, uncomplicated: Secondary | ICD-10-CM

## 2021-08-17 DIAGNOSIS — Z87891 Personal history of nicotine dependence: Secondary | ICD-10-CM

## 2021-08-17 DIAGNOSIS — Z122 Encounter for screening for malignant neoplasm of respiratory organs: Secondary | ICD-10-CM

## 2021-08-17 NOTE — Telephone Encounter (Signed)
[facility] calling again a/b call report on this pt number [phone].Melissa Jimenez

## 2021-08-17 NOTE — Telephone Encounter (Signed)
I have attempted to call the patient with the results of their  Low Dose CT Chest Lung cancer screening scan. There was no answer. I have left a HIPPA compliant VM requesting the patient call the office for the scan results. I included the office contact information in the message. We will await his return call. If no return call we will continue to call until patient is contacted.   While this scan is a Lung RADS 1, negative study: no nodules or definitely benign nodules. Radiology recommendation is for a repeat LDCT in 12 months. There was a notation of a thyroid nodule measuring 1.8 cm that needs a dedicated thyroid  US as follow up.  Once patient returns call we can notify her PCP, Dr. Sela Hilding,  to follow up as they feel is indicated.  We will await the return call from the patient.

## 2021-08-17 NOTE — Telephone Encounter (Signed)
Called and spoke with pt and reviewed CT results with her. I let her know that there were no suspicious or concerning nodules see on the lungs. There was some coronary calcifications seen but she is also on Crestor. Advised pt that there is a nodule on her thyroid that the radiologist has recommended a thyroid ultrasound. Pt is aware that we are sending this note to Dr Lindell Noe for her to address the ultrasound. Pt is aware that we will repeat her Chest Ct in 12 mths.

## 2021-08-20 ENCOUNTER — Telehealth: Payer: Self-pay | Admitting: Acute Care

## 2021-08-20 NOTE — Telephone Encounter (Signed)
Duplicate encounter

## 2021-08-20 NOTE — Telephone Encounter (Signed)
Received call report from [place] with [facility] on patient's CT lung cancer screen done on 08/15/21. Sarah please review the result/impression copied below:  IMPRESSION: 1. Lung-RADS 1, negative. Continue annual screening with low-dose chest CT without contrast in 12 months. Hepatic steatosis. 2. 1.8 cm left thyroid nodule. Recommend thyroid US (ref: J Am Coll Radiol. 2015 Feb;12(2): 143-50). 3. Coronary artery atherosclerosis. Aortic Atherosclerosis (ICD10-I70.0). 4. Hepatic steatosis    Also routing this to lung nodule pool. Please advise, thank you.

## 2021-08-20 NOTE — Telephone Encounter (Signed)
See telephone note 08/17/21

## 2021-08-21 ENCOUNTER — Other Ambulatory Visit: Payer: Self-pay | Admitting: Family Medicine

## 2021-08-21 DIAGNOSIS — E041 Nontoxic single thyroid nodule: Secondary | ICD-10-CM

## 2021-08-22 ENCOUNTER — Ambulatory Visit
Admission: RE | Admit: 2021-08-22 | Discharge: 2021-08-22 | Disposition: A | Discharge status: Home / Self Care | Payer: PPO | Source: Ambulatory Visit | Attending: Family Medicine | Admitting: Family Medicine

## 2021-08-22 DIAGNOSIS — E041 Nontoxic single thyroid nodule: Secondary | ICD-10-CM | POA: Diagnosis not present

## 2021-08-27 ENCOUNTER — Other Ambulatory Visit (HOSPITAL_COMMUNITY): Payer: Self-pay

## 2021-09-03 ENCOUNTER — Other Ambulatory Visit (HOSPITAL_COMMUNITY): Payer: Self-pay

## 2021-09-05 ENCOUNTER — Other Ambulatory Visit (HOSPITAL_COMMUNITY): Payer: Self-pay

## 2021-09-05 MED ORDER — BUPROPION HCL ER (XL) 150 MG PO TB24
150.0000 mg | ORAL_TABLET | Freq: Every morning | ORAL | 0 refills | Status: DC
  Filled 2021-09-05: qty 90, 90d supply, fill #0

## 2021-09-27 DIAGNOSIS — E042 Nontoxic multinodular goiter: Secondary | ICD-10-CM | POA: Diagnosis not present

## 2021-10-09 ENCOUNTER — Other Ambulatory Visit: Payer: Self-pay | Admitting: Surgery

## 2021-10-09 DIAGNOSIS — E041 Nontoxic single thyroid nodule: Secondary | ICD-10-CM

## 2021-10-11 ENCOUNTER — Other Ambulatory Visit (HOSPITAL_COMMUNITY)
Admission: RE | Admit: 2021-10-11 | Discharge: 2021-10-11 | Disposition: A | Discharge status: Home / Self Care | Payer: PPO | Source: Ambulatory Visit | Attending: Surgery | Admitting: Surgery

## 2021-10-11 ENCOUNTER — Ambulatory Visit
Admission: RE | Admit: 2021-10-11 | Discharge: 2021-10-11 | Disposition: A | Discharge status: Home / Self Care | Payer: PPO | Source: Ambulatory Visit | Attending: Surgery | Admitting: Surgery

## 2021-10-11 DIAGNOSIS — E041 Nontoxic single thyroid nodule: Secondary | ICD-10-CM | POA: Diagnosis not present

## 2021-10-12 ENCOUNTER — Other Ambulatory Visit: Payer: PPO

## 2021-10-12 LAB — CYTOLOGY - NON PAP

## 2021-10-16 NOTE — Progress Notes (Signed)
Unfortunately the FNA biopsy did not obtain enough thyroid tissue for the pathologist to make a diagnosis.  I recommend that we contact radiology and repeat the biopsy.  [name] - please contact patient to schedule repeat biopsy.  [place], MD [facility] [place] practice Office: [phone]

## 2021-10-17 ENCOUNTER — Other Ambulatory Visit (HOSPITAL_COMMUNITY): Payer: Self-pay

## 2021-10-23 ENCOUNTER — Other Ambulatory Visit (HOSPITAL_COMMUNITY): Payer: Self-pay

## 2021-10-25 ENCOUNTER — Other Ambulatory Visit: Payer: Self-pay | Admitting: Surgery

## 2021-10-25 DIAGNOSIS — E041 Nontoxic single thyroid nodule: Secondary | ICD-10-CM

## 2021-11-20 DIAGNOSIS — M19012 Primary osteoarthritis, left shoulder: Secondary | ICD-10-CM | POA: Diagnosis not present

## 2021-11-20 DIAGNOSIS — M19011 Primary osteoarthritis, right shoulder: Secondary | ICD-10-CM | POA: Diagnosis not present

## 2021-11-22 ENCOUNTER — Other Ambulatory Visit (HOSPITAL_COMMUNITY)
Admission: RE | Admit: 2021-11-22 | Discharge: 2021-11-22 | Disposition: A | Discharge status: Home / Self Care | Payer: PPO | Source: Ambulatory Visit | Attending: Radiology | Admitting: Radiology

## 2021-11-22 ENCOUNTER — Ambulatory Visit
Admission: RE | Admit: 2021-11-22 | Discharge: 2021-11-22 | Disposition: A | Discharge status: Home / Self Care | Payer: PPO | Source: Ambulatory Visit | Attending: Surgery | Admitting: Surgery

## 2021-11-22 ENCOUNTER — Other Ambulatory Visit (HOSPITAL_COMMUNITY): Payer: Self-pay

## 2021-11-22 DIAGNOSIS — E041 Nontoxic single thyroid nodule: Secondary | ICD-10-CM

## 2021-11-26 ENCOUNTER — Other Ambulatory Visit (HOSPITAL_COMMUNITY): Payer: Self-pay

## 2021-11-26 MED ORDER — ROSUVASTATIN CALCIUM 5 MG PO TABS
5.0000 mg | ORAL_TABLET | Freq: Every day | ORAL | 1 refills | Status: DC
  Filled 2021-11-26: qty 90, 90d supply, fill #0
  Filled 2022-03-05: qty 90, 90d supply, fill #1

## 2021-11-27 ENCOUNTER — Other Ambulatory Visit: Payer: PPO

## 2021-11-28 LAB — CYTOLOGY - NON PAP

## 2021-11-29 DIAGNOSIS — Z23 Encounter for immunization: Secondary | ICD-10-CM | POA: Diagnosis not present

## 2021-11-29 DIAGNOSIS — Z1211 Encounter for screening for malignant neoplasm of colon: Secondary | ICD-10-CM | POA: Diagnosis not present

## 2021-11-29 DIAGNOSIS — E119 Type 2 diabetes mellitus without complications: Secondary | ICD-10-CM | POA: Diagnosis not present

## 2021-11-29 DIAGNOSIS — Z0000 Encounter for general adult medical examination without abnormal findings: Secondary | ICD-10-CM | POA: Diagnosis not present

## 2021-11-29 DIAGNOSIS — E782 Mixed hyperlipidemia: Secondary | ICD-10-CM | POA: Diagnosis not present

## 2021-11-29 DIAGNOSIS — Z720 Tobacco use: Secondary | ICD-10-CM | POA: Diagnosis not present

## 2021-11-29 NOTE — Progress Notes (Signed)
FNA biopsy did not obtain enough tissue for diagnosis.  Patient will need to have biopsy done again by interventional radiology.  [name] - please reschedule biopsy and notify radiology of repeat.  tmg  Melissa Jimenez, [facility] [place] practice Office: [phone]

## 2021-12-03 DIAGNOSIS — Z1211 Encounter for screening for malignant neoplasm of colon: Secondary | ICD-10-CM | POA: Diagnosis not present

## 2021-12-04 ENCOUNTER — Other Ambulatory Visit: Payer: Self-pay | Admitting: Surgery

## 2021-12-04 DIAGNOSIS — E042 Nontoxic multinodular goiter: Secondary | ICD-10-CM

## 2021-12-07 ENCOUNTER — Other Ambulatory Visit: Payer: Self-pay | Admitting: Family Medicine

## 2021-12-10 ENCOUNTER — Other Ambulatory Visit: Payer: Self-pay | Admitting: Family Medicine

## 2021-12-10 DIAGNOSIS — Z1231 Encounter for screening mammogram for malignant neoplasm of breast: Secondary | ICD-10-CM

## 2021-12-11 ENCOUNTER — Other Ambulatory Visit (HOSPITAL_COMMUNITY): Payer: Self-pay

## 2021-12-11 ENCOUNTER — Other Ambulatory Visit: Payer: Self-pay | Admitting: Family Medicine

## 2021-12-11 DIAGNOSIS — E2839 Other primary ovarian failure: Secondary | ICD-10-CM

## 2021-12-11 MED ORDER — BUPROPION HCL ER (XL) 150 MG PO TB24
150.0000 mg | ORAL_TABLET | Freq: Every morning | ORAL | 3 refills | Status: DC
  Filled 2021-12-11: qty 90, 90d supply, fill #0
  Filled 2022-03-24: qty 90, 90d supply, fill #1
  Filled 2022-07-08: qty 90, 90d supply, fill #2
  Filled 2022-10-11: qty 90, 90d supply, fill #3

## 2021-12-24 ENCOUNTER — Other Ambulatory Visit (HOSPITAL_COMMUNITY)
Admission: RE | Admit: 2021-12-24 | Discharge: 2021-12-24 | Disposition: A | Discharge status: Home / Self Care | Payer: PPO | Source: Ambulatory Visit | Attending: Surgery | Admitting: Surgery

## 2021-12-24 ENCOUNTER — Ambulatory Visit
Admission: RE | Admit: 2021-12-24 | Discharge: 2021-12-24 | Disposition: A | Discharge status: Home / Self Care | Payer: PPO | Source: Ambulatory Visit | Attending: Surgery | Admitting: Surgery

## 2021-12-24 DIAGNOSIS — E041 Nontoxic single thyroid nodule: Secondary | ICD-10-CM | POA: Diagnosis not present

## 2021-12-24 DIAGNOSIS — E042 Nontoxic multinodular goiter: Secondary | ICD-10-CM

## 2021-12-24 DIAGNOSIS — E0789 Other specified disorders of thyroid: Secondary | ICD-10-CM | POA: Diagnosis not present

## 2021-12-27 LAB — CYTOLOGY - NON PAP

## 2021-12-27 NOTE — Progress Notes (Signed)
FNA biopsy shows some atypia.  Will be sent for molecular genetic testing - AFIRMA.  Will take approx 2 weeks to get results.  Will notify patient when results are available.  [place], MD [facility] [place] practice Office: [phone]

## 2022-01-02 DIAGNOSIS — E042 Nontoxic multinodular goiter: Secondary | ICD-10-CM | POA: Diagnosis not present

## 2022-01-02 DIAGNOSIS — Z961 Presence of intraocular lens: Secondary | ICD-10-CM | POA: Diagnosis not present

## 2022-01-02 DIAGNOSIS — E119 Type 2 diabetes mellitus without complications: Secondary | ICD-10-CM | POA: Diagnosis not present

## 2022-01-14 ENCOUNTER — Encounter (HOSPITAL_COMMUNITY): Payer: Self-pay

## 2022-01-16 ENCOUNTER — Ambulatory Visit: Payer: Self-pay | Admitting: Surgery

## 2022-01-16 DIAGNOSIS — D440 Neoplasm of uncertain behavior of thyroid gland: Secondary | ICD-10-CM | POA: Diagnosis not present

## 2022-01-16 DIAGNOSIS — E042 Nontoxic multinodular goiter: Secondary | ICD-10-CM | POA: Diagnosis not present

## 2022-01-17 ENCOUNTER — Other Ambulatory Visit (HOSPITAL_COMMUNITY): Payer: Self-pay

## 2022-01-22 ENCOUNTER — Other Ambulatory Visit (HOSPITAL_COMMUNITY): Payer: Self-pay

## 2022-01-22 MED ORDER — METFORMIN HCL ER 500 MG PO TB24
500.0000 mg | ORAL_TABLET | Freq: Two times a day (BID) | ORAL | 3 refills | Status: DC
  Filled 2022-01-22: qty 180, 90d supply, fill #0
  Filled 2022-06-06: qty 180, 90d supply, fill #1
  Filled 2022-10-23: qty 180, 90d supply, fill #2

## 2022-01-24 ENCOUNTER — Ambulatory Visit: Payer: PPO

## 2022-02-04 ENCOUNTER — Ambulatory Visit
Admission: RE | Admit: 2022-02-04 | Discharge: 2022-02-04 | Disposition: A | Discharge status: Home / Self Care | Payer: PPO | Source: Ambulatory Visit | Attending: Family Medicine | Admitting: Family Medicine

## 2022-02-04 DIAGNOSIS — Z1231 Encounter for screening mammogram for malignant neoplasm of breast: Secondary | ICD-10-CM

## 2022-02-19 DIAGNOSIS — M19011 Primary osteoarthritis, right shoulder: Secondary | ICD-10-CM | POA: Diagnosis not present

## 2022-02-19 DIAGNOSIS — M19012 Primary osteoarthritis, left shoulder: Secondary | ICD-10-CM | POA: Diagnosis not present

## 2022-03-05 ENCOUNTER — Other Ambulatory Visit (HOSPITAL_COMMUNITY): Payer: Self-pay

## 2022-03-08 NOTE — Patient Instructions (Signed)
SURGICAL WAITING ROOM VISITATION  Patients having surgery or a procedure may have no more than 2 support people in the waiting area - these visitors may rotate.    Children under the age of 6 must have an adult with them who is not the patient.  Due to an increase in RSV and influenza rates and associated hospitalizations, children ages 57 and under may not visit patients in [place].  If the patient needs to stay at the hospital during part of their recovery, the visitor guidelines for inpatient rooms apply. Pre-op nurse will coordinate an appropriate time for 1 support person to accompany patient in pre-op.  This support person may not rotate.    Please refer to the [facility] website for the visitor guidelines for Inpatients (after your surgery is over and you are in a regular room).    Your procedure is scheduled on: 03/28/22   Report to [facility] Main Entrance    Report to admitting at 5:15 AM   Call this number if you have problems the morning of surgery [phone]   Do not eat food :After Midnight.   After Midnight you may have the following liquids until 4:30 AM DAY OF SURGERY  Water Non-Citrus Juices (without pulp, NO RED-Apple, White grape, White cranberry) Black Coffee (NO MILK/CREAM OR CREAMERS, sugar ok)  Clear Tea (NO MILK/CREAM OR CREAMERS, sugar ok) regular and decaf                             Plain Jell-O (NO RED)                                           Fruit ices (not with fruit pulp, NO RED)                                     Popsicles (NO RED)                                                               Sports drinks like Gatorade (NO RED)                      If you have questions, please contact your surgeon's office.   FOLLOW BOWEL PREP AND ANY ADDITIONAL PRE OP INSTRUCTIONS YOU RECEIVED FROM YOUR SURGEON'S OFFICE!!!     Oral Hygiene is also important to reduce your risk of infection.                                     Remember - BRUSH YOUR TEETH THE MORNING OF SURGERY WITH YOUR REGULAR TOOTHPASTE  DENTURES WILL BE REMOVED PRIOR TO SURGERY PLEASE DO NOT APPLY "Poly grip" OR ADHESIVES!!!   Do NOT smoke after Midnight   Take these medicines the morning of surgery with A SIP OF WATER: Bupropion   DO NOT TAKE ANY ORAL DIABETIC MEDICATIONS DAY OF YOUR SURGERY  How to Manage Your Diabetes  Before and After Surgery  Why is it important to control my blood sugar before and after surgery? Improving blood sugar levels before and after surgery helps healing and can limit problems. A way of improving blood sugar control is eating a healthy diet by:  Eating less sugar and carbohydrates  Increasing activity/exercise  Talking with your doctor about reaching your blood sugar goals High blood sugars (greater than 180 mg/dL) can raise your risk of infections and slow your recovery, so you will need to focus on controlling your diabetes during the weeks before surgery. Make sure that the doctor who takes care of your diabetes knows about your planned surgery including the date and location.  How do I manage my blood sugar before surgery? Check your blood sugar at least 4 times a day, starting 2 days before surgery, to make sure that the level is not too high or low. Check your blood sugar the morning of your surgery when you wake up and every 2 hours until you get to the Short Stay unit. If your blood sugar is less than 70 mg/dL, you will need to treat for low blood sugar: Do not take insulin. Treat a low blood sugar (less than 70 mg/dL) with  cup of clear juice (cranberry or apple), 4 glucose tablets, OR glucose gel. Recheck blood sugar in 15 minutes after treatment (to make sure it is greater than 70 mg/dL). If your blood sugar is not greater than 70 mg/dL on recheck, call [phone] for further instructions. Report your blood sugar to the short stay nurse when you get to Short Stay.  If you are admitted to the  hospital after surgery: Your blood sugar will be checked by the staff and you will probably be given insulin after surgery (instead of oral diabetes medicines) to make sure you have good blood sugar levels. The goal for blood sugar control after surgery is 80-180 mg/dL.   WHAT DO I DO ABOUT MY DIABETES MEDICATION?  Do not take oral diabetes medicines (pills) the morning of surgery.  THE DAY BEFORE SURGERY, take Metformin as prescribed.      THE MORNING OF SURGERY, do not take Metformin.   Reviewed and Endorsed by [facility] Patient Education Committee, August 2015  Bring CPAP mask and tubing day of surgery.                              You may not have any metal on your body including hair pins, jewelry, and body piercing             Do not wear make-up, lotions, powders, perfumes, or deodorant  Do not wear nail polish including gel and S&S, artificial/acrylic nails, or any other type of covering on natural nails including finger and toenails. If you have artificial nails, gel coating, etc. that needs to be removed by a nail salon please have this removed prior to surgery or surgery may need to be canceled/ delayed if the surgeon/ anesthesia feels like they are unable to be safely monitored.   Do not shave  48 hours prior to surgery.    Do not bring valuables to the hospital. [place].   Contacts, glasses, dentures or bridgework may not be worn into surgery.   Bring small overnight bag day of surgery.   DO NOT BRING  [place]. PHARMACY WILL DISPENSE MEDICATIONS LISTED ON YOUR MEDICATION LIST TO YOU DURING YOUR ADMISSION [place]!   Special Instructions: Bring a copy of your healthcare power of attorney and living will documents the day of surgery if you haven't scanned them before.              Please read over the following fact sheets you were given: IF [place] [id number] Schneiders    If you received a COVID test during your pre-op visit  it is requested that you wear a mask when out in public, stay away from anyone that may not be feeling well and notify your surgeon if you develop symptoms. If you test positive for Covid or have been in contact with anyone that has tested positive in the last 10 days please notify you surgeon.    [facility] - Preparing for Surgery Before surgery, you can play an important role.  Because skin is not sterile, your skin needs to be as free of germs as possible.  You can reduce the number of germs on your skin by washing with CHG (chlorahexidine gluconate) soap before surgery.  CHG is an antiseptic cleaner which kills germs and bonds with the skin to continue killing germs even after washing. Please DO NOT use if you have an allergy to CHG or antibacterial soaps.  If your skin becomes reddened/irritated stop using the CHG and inform your nurse when you arrive at Short Stay. Do not shave (including legs and underarms) for at least 48 hours prior to the first CHG shower.  You may shave your face/neck.  Please follow these instructions carefully:  1.  Shower with CHG Soap the night before surgery and the  morning of surgery.  2.  If you choose to wash your hair, wash your hair first as usual with your normal  shampoo.  3.  After you shampoo, rinse your hair and body thoroughly to remove the shampoo.                             4.  Use CHG as you would any other liquid soap.  You can apply chg directly to the skin and wash.  Gently with a scrungie or clean washcloth.  5.  Apply the CHG Soap to your body ONLY FROM THE NECK DOWN.   Do   not use on face/ open                           Wound or open sores. Avoid contact with eyes, ears mouth and   genitals (private parts).                       Wash face,  Genitals (private parts) with your normal soap.             6.  Wash thoroughly, paying special  attention to the area where your    surgery  will be performed.  7.  Thoroughly rinse your body with warm water from the neck down.  8.  DO NOT shower/wash with your normal soap after using and rinsing off the CHG Soap.                9.  Pat yourself dry with a clean towel.  10.  Wear clean pajamas.            11.  Place clean sheets on your bed the night of your first shower and do not  sleep with pets. Day of Surgery : Do not apply any lotions/deodorants the morning of surgery.  Please wear clean clothes to the hospital/surgery center.  FAILURE TO FOLLOW THESE INSTRUCTIONS MAY RESULT IN THE CANCELLATION OF YOUR SURGERY  PATIENT SIGNATURE_________________________________  NURSE SIGNATURE__________________________________  ________________________________________________________________________

## 2022-03-08 NOTE — Progress Notes (Signed)
COVID Vaccine Completed:  Date of COVID positive in last 90 days:  PCP - Sela Hilding, MD Cardiologist -   Chest x-ray - CT 08/17/21 Epic EKG -  Stress Test -  ECHO -  Cardiac Cath -  Pacemaker/ICD device last checked: Spinal Cord Stimulator:  Bowel Prep -   Sleep Study -  CPAP -   Fasting Blood Sugar -  Checks Blood Sugar _____ times a day  Last dose of GLP1 agonist-  N/A GLP1 instructions:  N/A   Last dose of SGLT-2 inhibitors-  N/A SGLT-2 instructions: N/A   Blood Thinner Instructions: Aspirin Instructions: Last Dose:  Activity level:  Can go up a flight of stairs and perform activities of daily living without stopping and without symptoms of chest pain or shortness of breath.  Able to exercise without symptoms  Unable to go up a flight of stairs without symptoms of     Anesthesia review:   Patient denies shortness of breath, fever, cough and chest pain at PAT appointment  Patient verbalized understanding of instructions that were given to them at the PAT appointment. Patient was also instructed that they will need to review over the PAT instructions again at home before surgery.

## 2022-03-12 ENCOUNTER — Encounter (HOSPITAL_COMMUNITY): Payer: Self-pay

## 2022-03-12 ENCOUNTER — Encounter (HOSPITAL_COMMUNITY)
Admission: RE | Admit: 2022-03-12 | Discharge: 2022-03-12 | Disposition: A | Discharge status: Home / Self Care | Payer: PPO | Source: Ambulatory Visit | Attending: Surgery | Admitting: Surgery

## 2022-03-12 VITALS — BP 139/88 | HR 69 | Temp 97.6°F | Resp 14 | Ht 63.0 in | Wt 205.0 lb

## 2022-03-12 DIAGNOSIS — Z01818 Encounter for other preprocedural examination: Secondary | ICD-10-CM | POA: Diagnosis not present

## 2022-03-12 DIAGNOSIS — I491 Atrial premature depolarization: Secondary | ICD-10-CM | POA: Diagnosis not present

## 2022-03-12 DIAGNOSIS — E119 Type 2 diabetes mellitus without complications: Secondary | ICD-10-CM | POA: Insufficient documentation

## 2022-03-12 HISTORY — DX: Anxiety disorder, unspecified: F41.9

## 2022-03-12 HISTORY — DX: Unspecified osteoarthritis, unspecified site: M19.90

## 2022-03-12 HISTORY — DX: Type 2 diabetes mellitus without complications: E11.9

## 2022-03-12 LAB — BASIC METABOLIC PANEL
Anion gap: 9 (ref 5–15)
BUN: 18 mg/dL (ref 8–23)
CO2: 25 mmol/L (ref 22–32)
Calcium: 9 mg/dL (ref 8.9–10.3)
Chloride: 103 mmol/L (ref 98–111)
Creatinine, Ser: 0.84 mg/dL (ref 0.44–1.00)
GFR, Estimated: >60 mL/min (ref >60)
Glucose, Bld: 122 mg/dL — ABNORMAL HIGH (ref 70–99)
Potassium: 4.1 mmol/L (ref 3.5–5.1)
Sodium: 137 mmol/L (ref 135–145)

## 2022-03-12 LAB — CBC
HCT: 44.6 % (ref 36.0–46.0)
Hemoglobin: 14.4 g/dL (ref 12.0–15.0)
MCH: 31.4 pg (ref 26.0–34.0)
MCHC: 32.3 g/dL (ref 30.0–36.0)
MCV: 97.4 fL (ref 80.0–100.0)
Platelets: 298 10*3/uL (ref 150–400)
RBC: 4.58 MIL/uL (ref 3.87–5.11)
RDW: 14 % (ref 11.5–15.5)
WBC: 12.2 10*3/uL — ABNORMAL HIGH (ref 4.0–10.5)
nRBC: 0 % (ref 0.0–0.2)

## 2022-03-12 LAB — GLUCOSE, CAPILLARY: Glucose-Capillary: 127 mg/dL — ABNORMAL HIGH (ref 70–99)

## 2022-03-13 LAB — HEMOGLOBIN A1C
Hgb A1c MFr Bld: 6.8 % — ABNORMAL HIGH (ref 4.8–5.6)
Mean Plasma Glucose: 148 mg/dL

## 2022-03-22 ENCOUNTER — Other Ambulatory Visit: Payer: Self-pay

## 2022-03-22 ENCOUNTER — Other Ambulatory Visit (HOSPITAL_COMMUNITY): Payer: Self-pay

## 2022-03-22 MED ORDER — GLUCOSE BLOOD VI STRP
ORAL_STRIP | 0 refills | Status: AC
  Filled 2022-03-22: qty 50, 50d supply, fill #0

## 2022-03-22 MED ORDER — ONETOUCH DELICA PLUS LANCET33G MISC
0 refills | Status: AC
  Filled 2022-03-22: qty 100, 90d supply, fill #0

## 2022-03-22 MED ORDER — ONETOUCH VERIO FLEX SYSTEM W/DEVICE KIT
PACK | 0 refills | Status: DC
  Filled 2022-03-22: qty 1, 30d supply, fill #0

## 2022-03-23 ENCOUNTER — Encounter (HOSPITAL_COMMUNITY): Payer: Self-pay | Admitting: Surgery

## 2022-03-23 DIAGNOSIS — D440 Neoplasm of uncertain behavior of thyroid gland: Secondary | ICD-10-CM | POA: Diagnosis present

## 2022-03-23 DIAGNOSIS — E042 Nontoxic multinodular goiter: Secondary | ICD-10-CM | POA: Diagnosis present

## 2022-03-23 NOTE — H&P (Signed)
     PROVIDER: Aundra Espin Charlotta Newton, MD   Chief Complaint: Follow-up (discuss thyroid bx results)  History of Present Illness:  Patient returns today for follow-up having undergone ultrasound-guided fine-needle aspiration biopsy of the 2.8 cm nodule in the left lower thyroid lobe on December 24, 2021. Cytopathology demonstrated atypia. Specimen was submitted for molecular genetic testing with [facility]. Results returned as suspicious, rendering a risk of malignancy of approximately 50%. No additional DNA mutations were identified. Previous ultrasound demonstrated nodules in the left thyroid lobe with what appeared to be a normal right thyroid lobe. Patient returns today to discuss thyroid lobectomy for definitive diagnosis and management.  Review of Systems: A complete review of systems was obtained from the patient. I have reviewed this information and discussed as appropriate with the patient. See HPI as well for other ROS.  Review of Systems Constitutional: Negative. HENT: Negative. Eyes: Negative. Respiratory: Negative. Cardiovascular: Negative. Gastrointestinal: Negative. Genitourinary: Negative. Musculoskeletal: Negative. Skin: Negative. Neurological: Negative. Endo/Heme/Allergies: Negative. Psychiatric/Behavioral: Negative.   Medical History: Past Medical History: Diagnosis Date Anxiety Arthritis Diabetes mellitus without complication (CMS-HCC) Hyperlipidemia  Patient Active Problem List Diagnosis Multiple thyroid nodules Neoplasm of uncertain behavior of thyroid gland  Past Surgical History: Procedure Laterality Date CATARACT EXTRACTION Bilateral 2015   No Known Allergies  Current Outpatient Medications on File Prior to Visit Medication Sig Dispense Refill buPROPion (WELLBUTRIN XL) 150 MG XL tablet Take 1 tablet by mouth every morning calcium carbonate-vitamin D3 (OS-CAL 500+D) 500 mg-10 mcg (400 unit) tablet Take 1 tablet by mouth 2 (two) times daily with  meals metFORMIN (GLUCOPHAGE-XR) 500 MG XR tablet 1 tablet with meal rosuvastatin (CRESTOR) 5 MG tablet Take 1 tablet by mouth at bedtime  No current facility-administered medications on file prior to visit.  History reviewed. No pertinent family history.  Social History  Tobacco Use Smoking Status Every Day Packs/day: .5 Types: Cigarettes Smokeless Tobacco Never   Social History  Socioeconomic History Marital status: Married Tobacco Use Smoking status: Every Day Packs/day: .5 Types: Cigarettes Smokeless tobacco: Never Substance and Sexual Activity Alcohol use: Not Currently Drug use: Never  Objective:  Physical Exam  Limited examination  Palpation of the anterior neck shows no dominant or discrete nodules. No lymphadenopathy. No tenderness. Voice quality is normal.   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 The [facility] test returned as suspicious, rendering a risk of malignancy of approximately 50%. Patient has multiple nodules in the left thyroid lobe. I have recommended proceeding with left thyroid lobectomy. We discussed the procedure. We discussed the size and location of the surgical incision. We discussed the risk of the procedure including the risk of recurrent laryngeal nerve injury and injury to parathyroid glands. We discussed the hospital stay to be anticipated. We discussed the potential need for additional surgery if the patient were to require radioactive iodine treatment at a later date. The patient understands and agrees to proceed in the near future. We will submit orders to our schedulers and work on a convenient date for the patient to undergo her procedure.   Armandina Gemma, MD [facility] [place] practice Office: [phone]

## 2022-03-25 ENCOUNTER — Other Ambulatory Visit (HOSPITAL_COMMUNITY): Payer: Self-pay

## 2022-03-27 NOTE — Anesthesia Preprocedure Evaluation (Addendum)
Anesthesia Evaluation  Patient identified by MRN, date of birth, ID band Patient awake    Reviewed: Allergy & Precautions, NPO status , Patient's Chart, lab work & pertinent test results  Airway Mallampati: III  TM Distance: >3 FB Neck ROM: Full    Dental  (+) Dental Advisory Given, Teeth Intact   Pulmonary Current SmokerPatient did not abstain from smoking.   Pulmonary exam normal breath sounds clear to auscultation       Cardiovascular negative cardio ROS  Rhythm:Regular Rate:Normal     Neuro/Psych   Anxiety     negative neurological ROS     GI/Hepatic negative GI ROS, Neg liver ROS,,,  Endo/Other  diabetes    Renal/GU negative Renal ROS     Musculoskeletal  (+) Arthritis ,    Abdominal   Peds  Hematology negative hematology ROS (+)   Anesthesia Other Findings   Reproductive/Obstetrics                             Anesthesia Physical Anesthesia Plan  ASA: 2  Anesthesia Plan: General   Post-op Pain Management: Tylenol PO (pre-op)*   Induction: Intravenous  PONV Risk Score and Plan: 3 and Ondansetron, Dexamethasone and Treatment may vary due to age or medical condition  Airway Management Planned: Oral ETT  Additional Equipment:   Intra-op Plan:   Post-operative Plan: Extubation in OR  Informed Consent: I have reviewed the patients History and Physical, chart, labs and discussed the procedure including the risks, benefits and alternatives for the proposed anesthesia with the patient or authorized representative who has indicated his/her understanding and acceptance.     Dental advisory given  Plan Discussed with: CRNA  Anesthesia Plan Comments: (+/- glidescope)       Anesthesia Quick Evaluation

## 2022-03-28 ENCOUNTER — Encounter (HOSPITAL_COMMUNITY)
Admission: RE | Disposition: A | Discharge status: Home / Self Care | Payer: Self-pay | Source: Ambulatory Visit | Attending: Surgery

## 2022-03-28 ENCOUNTER — Encounter (HOSPITAL_COMMUNITY): Payer: Self-pay | Admitting: Surgery

## 2022-03-28 ENCOUNTER — Other Ambulatory Visit: Payer: Self-pay

## 2022-03-28 ENCOUNTER — Ambulatory Visit (HOSPITAL_COMMUNITY)
Admission: RE | Admit: 2022-03-28 | Discharge: 2022-03-29 | Disposition: A | Discharge status: Home / Self Care | Payer: PPO | Source: Ambulatory Visit | Attending: Surgery | Admitting: Surgery

## 2022-03-28 ENCOUNTER — Ambulatory Visit (HOSPITAL_BASED_OUTPATIENT_CLINIC_OR_DEPARTMENT_OTHER): Payer: PPO | Admitting: Physician Assistant

## 2022-03-28 ENCOUNTER — Ambulatory Visit (HOSPITAL_COMMUNITY): Payer: PPO | Admitting: Physician Assistant

## 2022-03-28 DIAGNOSIS — E042 Nontoxic multinodular goiter: Secondary | ICD-10-CM | POA: Diagnosis not present

## 2022-03-28 DIAGNOSIS — F17210 Nicotine dependence, cigarettes, uncomplicated: Secondary | ICD-10-CM | POA: Diagnosis not present

## 2022-03-28 DIAGNOSIS — D34 Benign neoplasm of thyroid gland: Secondary | ICD-10-CM | POA: Diagnosis not present

## 2022-03-28 DIAGNOSIS — D440 Neoplasm of uncertain behavior of thyroid gland: Secondary | ICD-10-CM | POA: Diagnosis not present

## 2022-03-28 DIAGNOSIS — E119 Type 2 diabetes mellitus without complications: Secondary | ICD-10-CM

## 2022-03-28 DIAGNOSIS — F419 Anxiety disorder, unspecified: Secondary | ICD-10-CM | POA: Diagnosis not present

## 2022-03-28 HISTORY — PX: THYROID LOBECTOMY: SHX420

## 2022-03-28 LAB — GLUCOSE, CAPILLARY
Glucose-Capillary: 133 mg/dL — ABNORMAL HIGH (ref 70–99)
Glucose-Capillary: 143 mg/dL — ABNORMAL HIGH (ref 70–99)
Glucose-Capillary: 139 mg/dL — ABNORMAL HIGH (ref 70–99)
Glucose-Capillary: 175 mg/dL — ABNORMAL HIGH (ref 70–99)

## 2022-03-28 SURGERY — LOBECTOMY, THYROID
Anesthesia: General | Laterality: Left

## 2022-03-28 MED ORDER — DEXAMETHASONE SODIUM PHOSPHATE 10 MG/ML IJ SOLN
INTRAMUSCULAR | Status: AC
  Filled 2022-03-28: qty 1

## 2022-03-28 MED ORDER — FENTANYL CITRATE PF 50 MCG/ML IJ SOSY
25.0000 ug | PREFILLED_SYRINGE | INTRAMUSCULAR | Status: DC | PRN
  Administered 2022-03-28: 50 ug via INTRAVENOUS

## 2022-03-28 MED ORDER — DEXAMETHASONE SODIUM PHOSPHATE 10 MG/ML IJ SOLN
INTRAMUSCULAR | Status: DC | PRN
  Administered 2022-03-28: 10 mg via INTRAVENOUS

## 2022-03-28 MED ORDER — CEFAZOLIN SODIUM 1 G IJ SOLR
INTRAMUSCULAR | Status: AC
  Filled 2022-03-28: qty 20

## 2022-03-28 MED ORDER — TRAMADOL HCL 50 MG PO TABS
50.0000 mg | ORAL_TABLET | Freq: Four times a day (QID) | ORAL | Status: DC | PRN
  Administered 2022-03-28 – 2022-03-29 (×2): 50 mg via ORAL
  Filled 2022-03-28 (×2): qty 1

## 2022-03-28 MED ORDER — ROCURONIUM BROMIDE 10 MG/ML (PF) SYRINGE
PREFILLED_SYRINGE | INTRAVENOUS | Status: AC
  Filled 2022-03-28: qty 10

## 2022-03-28 MED ORDER — ACETAMINOPHEN 500 MG PO TABS
1000.0000 mg | ORAL_TABLET | Freq: Once | ORAL | Status: AC
  Administered 2022-03-28: 1000 mg via ORAL
  Filled 2022-03-28: qty 2

## 2022-03-28 MED ORDER — METFORMIN HCL ER 500 MG PO TB24: 1000.0000 mg | ORAL_TABLET | Freq: Every day | ORAL | Status: DC

## 2022-03-28 MED ORDER — CEFAZOLIN SODIUM-DEXTROSE 2-4 GM/100ML-% IV SOLN
2.0000 g | INTRAVENOUS | Status: AC
  Administered 2022-03-28: 2 g via INTRAVENOUS

## 2022-03-28 MED ORDER — AMISULPRIDE (ANTIEMETIC) 5 MG/2ML IV SOLN: 10.0000 mg | Freq: Once | INTRAVENOUS | Status: DC | PRN

## 2022-03-28 MED ORDER — HYDRALAZINE HCL 20 MG/ML IJ SOLN: 10.0000 mg | Freq: Once | INTRAMUSCULAR | Status: AC

## 2022-03-28 MED ORDER — FENTANYL CITRATE (PF) 100 MCG/2ML IJ SOLN
INTRAMUSCULAR | Status: DC | PRN
  Administered 2022-03-28 (×3): 50 ug via INTRAVENOUS

## 2022-03-28 MED ORDER — PROPOFOL 10 MG/ML IV BOLUS
INTRAVENOUS | Status: AC
  Filled 2022-03-28: qty 20

## 2022-03-28 MED ORDER — PHENYLEPHRINE HCL (PRESSORS) 10 MG/ML IV SOLN
INTRAVENOUS | Status: DC | PRN
  Administered 2022-03-28: 80 ug via INTRAVENOUS

## 2022-03-28 MED ORDER — HYDRALAZINE HCL 20 MG/ML IJ SOLN
10.0000 mg | Freq: Once | INTRAMUSCULAR | Status: AC
  Administered 2022-03-28: 10 mg via INTRAVENOUS

## 2022-03-28 MED ORDER — MIDAZOLAM HCL 2 MG/2ML IJ SOLN
INTRAMUSCULAR | Status: DC | PRN
  Administered 2022-03-28: 2 mg via INTRAVENOUS

## 2022-03-28 MED ORDER — HYDRALAZINE HCL 20 MG/ML IJ SOLN
INTRAMUSCULAR | Status: AC
  Administered 2022-03-28: 10 mg via INTRAVENOUS
  Filled 2022-03-28: qty 1

## 2022-03-28 MED ORDER — OXYCODONE HCL 5 MG PO TABS: 5.0000 mg | ORAL_TABLET | Freq: Once | ORAL | Status: DC | PRN

## 2022-03-28 MED ORDER — LIDOCAINE HCL (PF) 2 % IJ SOLN
INTRAMUSCULAR | Status: AC
  Filled 2022-03-28: qty 5

## 2022-03-28 MED ORDER — FENTANYL CITRATE PF 50 MCG/ML IJ SOSY
PREFILLED_SYRINGE | INTRAMUSCULAR | Status: AC
  Administered 2022-03-28: 25 ug via INTRAVENOUS
  Filled 2022-03-28: qty 3

## 2022-03-28 MED ORDER — PHENYLEPHRINE 80 MCG/ML (10ML) SYRINGE FOR IV PUSH (FOR BLOOD PRESSURE SUPPORT)
PREFILLED_SYRINGE | INTRAVENOUS | Status: AC
  Filled 2022-03-28: qty 10

## 2022-03-28 MED ORDER — ONDANSETRON HCL 4 MG/2ML IJ SOLN
INTRAMUSCULAR | Status: DC | PRN
  Administered 2022-03-28: 4 mg via INTRAVENOUS

## 2022-03-28 MED ORDER — SODIUM CHLORIDE 0.45 % IV SOLN: INTRAVENOUS | Status: DC

## 2022-03-28 MED ORDER — SUGAMMADEX SODIUM 200 MG/2ML IV SOLN
INTRAVENOUS | Status: DC | PRN
  Administered 2022-03-28: 200 mg via INTRAVENOUS

## 2022-03-28 MED ORDER — FENTANYL CITRATE (PF) 100 MCG/2ML IJ SOLN
INTRAMUSCULAR | Status: AC
  Filled 2022-03-28: qty 2

## 2022-03-28 MED ORDER — CHLORHEXIDINE GLUCONATE CLOTH 2 % EX PADS: 6.0000 | MEDICATED_PAD | Freq: Once | CUTANEOUS | Status: DC

## 2022-03-28 MED ORDER — ROCURONIUM BROMIDE 10 MG/ML (PF) SYRINGE
PREFILLED_SYRINGE | INTRAVENOUS | Status: DC | PRN
  Administered 2022-03-28: 60 mg via INTRAVENOUS

## 2022-03-28 MED ORDER — LACTATED RINGERS IV SOLN: INTRAVENOUS | Status: DC

## 2022-03-28 MED ORDER — ACETAMINOPHEN 325 MG PO TABS
ORAL_TABLET | ORAL | Status: AC
  Filled 2022-03-28: qty 2

## 2022-03-28 MED ORDER — OXYCODONE HCL 5 MG/5ML PO SOLN: 5.0000 mg | Freq: Once | ORAL | Status: DC | PRN

## 2022-03-28 MED ORDER — PROPOFOL 10 MG/ML IV BOLUS
INTRAVENOUS | Status: DC | PRN
  Administered 2022-03-28: 120 mg via INTRAVENOUS

## 2022-03-28 MED ORDER — MIDAZOLAM HCL 2 MG/2ML IJ SOLN
INTRAMUSCULAR | Status: AC
  Filled 2022-03-28: qty 2

## 2022-03-28 MED ORDER — BUPROPION HCL ER (XL) 150 MG PO TB24
150.0000 mg | ORAL_TABLET | Freq: Every morning | ORAL | Status: DC
  Administered 2022-03-29: 150 mg via ORAL
  Filled 2022-03-28: qty 1

## 2022-03-28 MED ORDER — CHLORHEXIDINE GLUCONATE 0.12 % MT SOLN
15.0000 mL | Freq: Once | OROMUCOSAL | Status: AC
  Administered 2022-03-28: 15 mL via OROMUCOSAL

## 2022-03-28 MED ORDER — INSULIN ASPART 100 UNIT/ML IJ SOLN: 0.0000 [IU] | Freq: Three times a day (TID) | INTRAMUSCULAR | Status: DC

## 2022-03-28 MED ORDER — ONDANSETRON 4 MG PO TBDP: 4.0000 mg | ORAL_TABLET | Freq: Four times a day (QID) | ORAL | Status: DC | PRN

## 2022-03-28 MED ORDER — HYDROMORPHONE HCL 1 MG/ML IJ SOLN: 1.0000 mg | INTRAMUSCULAR | Status: DC | PRN

## 2022-03-28 MED ORDER — ACETAMINOPHEN 325 MG PO TABS
650.0000 mg | ORAL_TABLET | Freq: Four times a day (QID) | ORAL | Status: DC | PRN
  Administered 2022-03-28: 650 mg via ORAL

## 2022-03-28 MED ORDER — POLYVINYL ALCOHOL 1.4 % OP SOLN
1.0000 [drp] | Freq: Every morning | OPHTHALMIC | Status: DC
  Administered 2022-03-29: 1 [drp] via OPHTHALMIC
  Filled 2022-03-28: qty 15

## 2022-03-28 MED ORDER — ONDANSETRON HCL 4 MG/2ML IJ SOLN
INTRAMUSCULAR | Status: AC
  Filled 2022-03-28: qty 2

## 2022-03-28 MED ORDER — LIDOCAINE 2% (20 MG/ML) 5 ML SYRINGE
INTRAMUSCULAR | Status: DC | PRN
  Administered 2022-03-28: 80 mg via INTRAVENOUS

## 2022-03-28 MED ORDER — ACETAMINOPHEN 650 MG RE SUPP: 650.0000 mg | Freq: Four times a day (QID) | RECTAL | Status: DC | PRN

## 2022-03-28 MED ORDER — ONDANSETRON HCL 4 MG/2ML IJ SOLN: 4.0000 mg | Freq: Four times a day (QID) | INTRAMUSCULAR | Status: DC | PRN

## 2022-03-28 MED ORDER — HEMOSTATIC AGENTS (NO CHARGE) OPTIME
TOPICAL | Status: DC | PRN
  Administered 2022-03-28: 1 via TOPICAL

## 2022-03-28 MED ORDER — ORAL CARE MOUTH RINSE: 15.0000 mL | Freq: Once | OROMUCOSAL | Status: AC

## 2022-03-28 MED ORDER — 0.9 % SODIUM CHLORIDE (POUR BTL) OPTIME
TOPICAL | Status: DC | PRN
  Administered 2022-03-28: 1000 mL

## 2022-03-28 MED ORDER — OXYCODONE HCL 5 MG PO TABS: 5.0000 mg | ORAL_TABLET | ORAL | Status: DC | PRN

## 2022-03-28 SURGICAL SUPPLY — 29 items
ATTRACTOMAT 16X20 MAGNETIC DRP (DRAPES) ×1 IMPLANT
BAG COUNTER SPONGE SURGICOUNT (BAG) ×1 IMPLANT
BLADE SURG 15 STRL LF DISP TIS (BLADE) ×1 IMPLANT
BLADE SURG 15 STRL SS (BLADE) ×1
CHLORAPREP W/TINT 26 (MISCELLANEOUS) ×1 IMPLANT
CLIP TI MEDIUM 6 (CLIP) ×2 IMPLANT
CLIP TI WIDE RED SMALL 6 (CLIP) ×2 IMPLANT
COVER SURGICAL LIGHT HANDLE (MISCELLANEOUS) ×1 IMPLANT
DERMABOND ADVANCED .7 DNX12 (GAUZE/BANDAGES/DRESSINGS) ×1 IMPLANT
DRAPE LAPAROTOMY T 98X78 PEDS (DRAPES) ×1 IMPLANT
DRAPE UTILITY XL STRL (DRAPES) ×1 IMPLANT
ELECT PENCIL ROCKER SW 15FT (MISCELLANEOUS) ×1 IMPLANT
ELECT REM PT RETURN 15FT ADLT (MISCELLANEOUS) ×1 IMPLANT
GAUZE 4X4 16PLY ~~LOC~~+RFID DBL (SPONGE) ×1 IMPLANT
GLOVE SURG ORTHO 8.0 STRL STRW (GLOVE) ×1 IMPLANT
GOWN STRL REUS W/ TWL XL LVL3 (GOWN DISPOSABLE) ×2 IMPLANT
GOWN STRL REUS W/TWL XL LVL3 (GOWN DISPOSABLE) ×2
HEMOSTAT SURGICEL 2X4 FIBR (HEMOSTASIS) ×1 IMPLANT
ILLUMINATOR WAVEGUIDE N/F (MISCELLANEOUS) ×1 IMPLANT
KIT BASIN OR (CUSTOM PROCEDURE TRAY) ×1 IMPLANT
KIT TURNOVER KIT A (KITS) IMPLANT
PACK BASIC VI WITH GOWN DISP (CUSTOM PROCEDURE TRAY) ×1 IMPLANT
SHEARS HARMONIC 9CM CVD (BLADE) ×1 IMPLANT
SUT MNCRL AB 4-0 PS2 18 (SUTURE) ×1 IMPLANT
SUT VIC AB 3-0 SH 18 (SUTURE) ×2 IMPLANT
SYR BULB IRRIG 60ML STRL (SYRINGE) ×1 IMPLANT
TOWEL OR 17X26 10 PK STRL BLUE (TOWEL DISPOSABLE) ×1 IMPLANT
TOWEL OR NON WOVEN STRL DISP B (DISPOSABLE) ×1 IMPLANT
TUBING CONNECTING 10 (TUBING) ×1 IMPLANT

## 2022-03-28 NOTE — Anesthesia Postprocedure Evaluation (Signed)
Anesthesia Post Note  Patient: Melissa Jimenez  Procedure(s) Performed: LEFT THYROID LOBECTOMY (Left)     Patient location during evaluation: PACU Anesthesia Type: General Level of consciousness: sedated and patient cooperative Pain management: pain level controlled Vital Signs Assessment: post-procedure vital signs reviewed and stable Respiratory status: spontaneous breathing Cardiovascular status: stable Anesthetic complications: no   No notable events documented.  Last Vitals:  Vitals:   03/28/22 1115 03/28/22 1200  BP: (!) 175/90 (!) 188/90  Pulse: 85 84  Resp: 15 17  Temp:    SpO2: 94% 94%    Last Pain:  Vitals:   03/28/22 1200  TempSrc:   PainSc: 0-No pain                 Nolon Nations

## 2022-03-28 NOTE — Anesthesia Procedure Notes (Signed)
Procedure Name: Intubation Date/Time: 03/28/2022 8:44 AM  Performed by: Sharlette Dense, CRNAPre-anesthesia Checklist: Patient identified, Emergency Drugs available, Suction available and Patient being monitored Patient Re-evaluated:Patient Re-evaluated prior to induction Oxygen Delivery Method: Circle system utilized Preoxygenation: Pre-oxygenation with 100% oxygen Induction Type: IV induction Ventilation: Mask ventilation without difficulty and Oral airway inserted - appropriate to patient size Laryngoscope Size: Sabra Heck and 2 Grade View: Grade I Tube type: Reinforced Tube size: 7.5 mm Number of attempts: 1 Airway Equipment and Method: Stylet and Oral airway Placement Confirmation: ETT inserted through vocal cords under direct vision, positive ETCO2 and breath sounds checked- equal and bilateral Secured at: 21 cm Tube secured with: Tape Dental Injury: Teeth and Oropharynx as per pre-operative assessment

## 2022-03-28 NOTE — Interval H&P Note (Signed)
History and Physical Interval Note:  03/28/2022 8:17 AM  Melissa Jimenez  has presented today for surgery, with the diagnosis of thyroid neoplasm.  The various methods of treatment have been discussed with the patient and family. After consideration of risks, benefits and other options for treatment, the patient has consented to    Procedure(s): LEFT THYROID LOBECTOMY (Left) as a surgical intervention.    The patient's history has been reviewed, patient examined, no change in status, stable for surgery.  I have reviewed the patient's chart and labs.  Questions were answered to the patient's satisfaction.    Armandina Gemma, [place] [facility] [place] practice Office: [place]

## 2022-03-28 NOTE — Transfer of Care (Signed)
Immediate Anesthesia Transfer of Care Note  Patient: Melissa Jimenez  Procedure(s) Performed: LEFT THYROID LOBECTOMY (Left)  Patient Location: PACU  Anesthesia Type:General  Level of Consciousness: drowsy  Airway & Oxygen Therapy: Patient Spontanous Breathing and Patient connected to face mask oxygen  Post-op Assessment: Report given to RN and Post -op Vital signs reviewed and stable  Post vital signs: Reviewed and stable  Last Vitals:  Vitals Value Taken Time  BP 203/118 03/28/22 1001  Temp    Pulse 78 03/28/22 1002  Resp 15 03/28/22 1002  SpO2 100 % 03/28/22 1002  Vitals shown include unvalidated device data.  Last Pain:  Vitals:   03/28/22 0702  TempSrc:   PainSc: 1       Patients Stated Pain Goal: 4 (95/18/84 1660)  Complications: No notable events documented.

## 2022-03-28 NOTE — Op Note (Signed)
Procedure Note  Pre-operative Diagnosis:  thyroid neoplasm of uncertain behavior, multiple thyroid nodules  Post-operative Diagnosis:  same  Surgeon:  Armandina Gemma, MD  Assistant:  none   Procedure:  Left thyroid lobectomy and isthmusectomy  Anesthesia:  General  Estimated Blood Loss:  minimal  Drains: none         Specimen: thyroid lobe to pathology  Indications:  Patient returns today for follow-up having undergone ultrasound-guided fine-needle aspiration biopsy of the 2.8 cm nodule in the left lower thyroid lobe on December 24, 2021. Cytopathology demonstrated atypia. Specimen was submitted for molecular genetic testing with [facility]. Results returned as suspicious, rendering a risk of malignancy of approximately 50%. No additional DNA mutations were identified. Previous ultrasound demonstrated nodules in the left thyroid lobe with what appeared to be a normal right thyroid lobe. Patient returns today to discuss thyroid lobectomy for definitive diagnosis and management.   Procedure Details: Procedure was done in OR #1 at the [facility]. The patient was brought to the operating room and placed in a supine position on the operating room table. Following administration of general anesthesia, the patient was positioned and then prepped and draped in the usual aseptic fashion. After ascertaining that an adequate level of anesthesia had been achieved, a small Kocher incision was made with #15 blade. Dissection was carried through subcutaneous tissues and platysma. Hemostasis was achieved with the electrocautery. Skin flaps were elevated cephalad and caudad from the thyroid notch to the sternal notch. A self-retaining retractor was placed for exposure. Strap muscles were incised in the midline and dissection was begun on the left side. Strap muscles were reflected laterally. The left thyroid lobe was mildly enlarged with a dominant nodule inferiorly. The lobe was gently mobilized with blunt  dissection. Superior pole vessels were dissected out and divided individually between small and medium ligaclips with the harmonic scalpel. The thyroid lobe was rolled anteriorly. Branches of the inferior thyroid artery were divided between small ligaclips with the harmonic scalpel. Inferior venous tributaries were divided between ligaclips. Both the superior and inferior parathyroid glands were identified and preserved on their vascular pedicles. The recurrent laryngeal nerve was identified and preserved along its course. The ligament of Gwenlyn Found was released with the electrocautery and the gland was mobilized onto the anterior trachea. Isthmus was mobilized across the midline. There was a moderate sized pyramidal lobe present which was resected with the isthmus. The thyroid parenchyma was transected at the junction of the isthmus and contralateral thyroid lobe with the harmonic scalpel. The thyroid lobe and isthmus were submitted to pathology for review.  Right thyroid lobe appeared normal in size and was without palpable nodules.  No abnormal lymph nodes were identified.  The neck was irrigated with warm saline. Fibrillar was placed throughout the operative field. Strap muscles were approximated in the midline with interrupted 3-0 Vicryl sutures. Platysma was closed with interrupted 3-0 Vicryl sutures. Skin was closed with a running 4-0 Monocryl subcuticular suture.  Wound was washed and dried and Dermabond was applied. The patient was awakened from anesthesia and brought to the recovery room. The patient tolerated the procedure well.   Armandina Gemma, [place] [facility] Office: [phone]

## 2022-03-29 ENCOUNTER — Encounter (HOSPITAL_COMMUNITY): Payer: Self-pay | Admitting: Surgery

## 2022-03-29 ENCOUNTER — Other Ambulatory Visit (HOSPITAL_COMMUNITY): Payer: Self-pay

## 2022-03-29 DIAGNOSIS — D34 Benign neoplasm of thyroid gland: Secondary | ICD-10-CM | POA: Diagnosis not present

## 2022-03-29 LAB — GLUCOSE, CAPILLARY: Glucose-Capillary: 120 mg/dL — ABNORMAL HIGH (ref 70–99)

## 2022-03-29 MED ORDER — TRAMADOL HCL 50 MG PO TABS
50.0000 mg | ORAL_TABLET | Freq: Four times a day (QID) | ORAL | 0 refills | Status: AC | PRN
  Filled 2022-03-29 (×2): qty 15, 2d supply, fill #0

## 2022-03-29 NOTE — TOC CM/SW Note (Signed)
Transition of Care [facility]) Screening Note  Patient Details  Name: Melissa Jimenez Date of Birth: 1950-08-25  Transition of [facility]) CM/SW Contact:    Sherie Don, LCSW Phone Number: 03/29/2022, 8:41 AM  Transition of Care Department [facility]) has reviewed patient and no TOC needs have been identified at this time. We will continue to monitor patient advancement through interdisciplinary progression rounds. If new patient transition needs arise, please place a TOC consult.

## 2022-03-29 NOTE — Discharge Summary (Signed)
Physician Discharge Summary   Patient ID: Melissa Jimenez MRN: [id number] DOB/AGE: [birth year] 72 y.o.  Admit date: 03/28/2022  Discharge date: 03/29/2022  Discharge Diagnoses:  Principal Problem:   Neoplasm of uncertain behavior of thyroid gland Active Problems:   Multiple thyroid nodules   Discharged Condition: good  Hospital Course: Patient was admitted for observation following thyroid lobectomy.  Post op course was uncomplicated.  Pain was well controlled.  Tolerated diet.  Patient was prepared for discharge home on POD#1.  Consults: None  Treatments: surgery: left thyroid lobectomy and isthmusectomy  Discharge Exam: Blood pressure (!) 149/49, pulse 63, temperature 98 F (36.7 C), temperature source Oral, resp. rate 16, height '5\' 3"'$  (1.6 m), weight 93 kg, SpO2 94 %. HEENT - clear Neck - wound dry and intact; Dermabond in place; voice with mild hoarseness, no stridor  Disposition: Home  Discharge Instructions     Diet - low sodium heart healthy   Complete by: As directed    Increase activity slowly   Complete by: As directed    No dressing needed   Complete by: As directed       Allergies as of 03/29/2022   No Known Allergies      Medication List     TAKE these medications    buPROPion 150 MG 24 hr tablet Commonly known as: WELLBUTRIN XL Take 1 tablet by mouth daily in the morning   CALCIUM + D3 PO Take 1 tablet by mouth every evening.   glucose blood test strip Use to check your blood sugar three times a day as directed   OneTouch Verio test strip Generic drug: glucose blood Use to check your blood sugar finger stick twice daily.   OneTouch Verio test strip Generic drug: glucose blood Use to check blood sugar daily   ibuprofen 200 MG tablet Commonly known as: ADVIL Take 600 mg by mouth 2 (two) times daily as needed (pain.).   metFORMIN 500 MG 24 hr tablet Commonly known as: GLUCOPHAGE-XR Take 1 tablet (500 mg total) by mouth 2  (two) times daily with a meal. What changed:  how much to take when to take this   OneTouch Delica Plus [id number] Misc Use to check your blood sugar with finger stick twice a day.   OneTouch Delica Plus [id number] Misc Use daily as directed   OneTouch Verio Flex System w/Device Kit Use to check blood sugar 2 times a day   [employer] System w/Device Kit Use daily as directed   Refresh Tears 0.5 % Soln Generic drug: carboxymethylcellulose Place 1 drop into both eyes in the morning.   rosuvastatin 5 MG tablet Commonly known as: CRESTOR Take 1 tablet by mouth once a day at bedtime   traMADol 50 MG tablet Commonly known as: ULTRAM Take 1-2 tablets (50-100 mg total) by mouth every 6 (six) hours as needed for moderate pain.               Discharge Care Instructions  (From admission, onward)           Start     Ordered   03/29/22 0000  No dressing needed        03/29/22 5035            Follow-up Information     Armandina Gemma, MD. Schedule an appointment as soon as possible for a visit in 3 week(s).   Specialty: General Surgery Why: For wound re-check Contact information: [address]  [address] [place] [id number] [phone]                 Amayiah Gosnell, [facility] Office: [phone]   Signed: Armandina Gemma 03/29/2022, 8:58 AM

## 2022-03-29 NOTE — Discharge Instructions (Signed)
[facility] - Dr. Gearldean Lomanto  THYROID & PARATHYROID SURGERY:  POST-OP INSTRUCTIONS  Always review the instruction sheet provided by the hospital nurse at discharge.  A prescription for pain medication may be sent to your pharmacy at the time of discharge.  Take your pain medication as prescribed.  If narcotic pain medicine is not needed, then you may take acetaminophen (Tylenol) or ibuprofen (Advil) as needed for pain or soreness.  Take your normal home medications as prescribed unless otherwise directed.  If you need a refill on your pain medication, please contact the office during regular business hours.  Prescriptions will not be processed by the office after 5:00PM or on weekends.  Start with a light diet upon arrival home, such as soup and crackers or toast.  Be sure to drink plenty of fluids.  Resume your normal diet the day after surgery.  Most patients will experience some swelling and bruising on the chest and neck area.  Ice packs will help for the first 48 hours after arriving home.  Swelling and bruising will take several days to resolve.   It is common to experience some constipation after surgery.  Increasing fluid intake and taking a stool softener (Colace) will usually help to prevent this problem.  A mild laxative (Milk of Magnesia or Miralax) should be taken according to package directions if there has been no bowel movement after 48 hours.  Dermabond glue covers your incision. This seals the wound and you may shower at any time. The Dermabond will remain in place for about a week.  You may gradually remove the glue when it loosens around the edges.  If you need to loosen the Dermabond for removal, apply a layer of Vaseline to the wound for 15 minutes and then remove with a Kleenex. Your sutures are under the skin and will not show - they will dissolve on their own.  You may resume light daily activities beginning the day after discharge (such as self-care,  walking, climbing stairs), gradually increasing activities as tolerated. You may have sexual intercourse when it is comfortable. Refrain from any heavy lifting or straining until approved by your doctor. You may drive when you no longer are taking prescription pain medication, you can comfortably wear a seatbelt, and you can safely maneuver your car and apply the brakes.  You will see your doctor in the office for a follow-up appointment approximately three weeks after your surgery.  Make sure that you call for this appointment within a day or two after you arrive home to insure a convenient appointment time. Please have any requested laboratory tests performed a few days prior to your office visit so that the results will be available at your follow up appointment.  WHEN TO CALL THE [facility] OFFICE: -- Fever greater than 101.5 -- Inability to urinate -- Nausea and/or vomiting - persistent -- Extreme swelling or bruising -- Continued bleeding from incision -- Increased pain, redness, or drainage from the incision -- Difficulty swallowing or breathing -- Muscle cramping or spasms -- Numbness or tingling in hands or around lips  The clinic staff is available to answer your questions during regular business hours.  Please don't hesitate to call and ask to speak to one of the nurses if you have concerns.  [facility] OFFICE: [phone] (24 hours)  Please sign up for MyChart accounts. This will allow you to communicate directly with my nurse or myself without having to call the office. It will also allow you   to view your test results. You will need to enroll in MyChart for my office ([facility]) and for the hospital ([place]).  [name], MD [facility] practice 

## 2022-03-29 NOTE — Progress Notes (Signed)
Patient was given discharge instructions, and all questions were answered. Patient was stable for discharge and was walked to the main exit. 

## 2022-04-01 LAB — SURGICAL PATHOLOGY

## 2022-04-01 NOTE — Progress Notes (Signed)
Great news!  Final path is benign.  Will have copy of report at office for patient at post op visit.  tmg  Armandina Gemma, [facility] [place] [facility] Office: [phone]

## 2022-04-05 ENCOUNTER — Other Ambulatory Visit (HOSPITAL_COMMUNITY): Payer: Self-pay

## 2022-04-05 DIAGNOSIS — M545 Low back pain, unspecified: Secondary | ICD-10-CM | POA: Diagnosis not present

## 2022-04-05 DIAGNOSIS — M25551 Pain in right hip: Secondary | ICD-10-CM | POA: Diagnosis not present

## 2022-04-05 DIAGNOSIS — M25552 Pain in left hip: Secondary | ICD-10-CM | POA: Diagnosis not present

## 2022-04-05 MED ORDER — MELOXICAM 15 MG PO TABS
15.0000 mg | ORAL_TABLET | Freq: Every day | ORAL | 0 refills | Status: DC | PRN
  Filled 2022-04-05: qty 30, 30d supply, fill #0

## 2022-04-05 MED ORDER — TIZANIDINE HCL 4 MG PO TABS
4.0000 mg | ORAL_TABLET | Freq: Four times a day (QID) | ORAL | 0 refills | Status: DC | PRN
  Filled 2022-04-05: qty 40, 10d supply, fill #0

## 2022-04-08 ENCOUNTER — Other Ambulatory Visit (HOSPITAL_COMMUNITY): Payer: Self-pay

## 2022-04-08 MED ORDER — HYDROCODONE-ACETAMINOPHEN 5-325 MG PO TABS
1.0000 | ORAL_TABLET | Freq: Four times a day (QID) | ORAL | 0 refills | Status: AC | PRN
  Filled 2022-04-08: qty 20, 5d supply, fill #0

## 2022-04-09 DIAGNOSIS — M545 Low back pain, unspecified: Secondary | ICD-10-CM | POA: Diagnosis not present

## 2022-04-10 ENCOUNTER — Other Ambulatory Visit (HOSPITAL_COMMUNITY): Payer: Self-pay

## 2022-04-10 DIAGNOSIS — M545 Low back pain, unspecified: Secondary | ICD-10-CM | POA: Diagnosis not present

## 2022-04-11 ENCOUNTER — Other Ambulatory Visit (HOSPITAL_COMMUNITY): Payer: Self-pay

## 2022-04-11 DIAGNOSIS — M5416 Radiculopathy, lumbar region: Secondary | ICD-10-CM | POA: Diagnosis not present

## 2022-04-11 MED ORDER — GABAPENTIN 300 MG PO CAPS
300.0000 mg | ORAL_CAPSULE | ORAL | 0 refills | Status: DC
  Filled 2022-04-11: qty 90, 30d supply, fill #0

## 2022-04-14 ENCOUNTER — Other Ambulatory Visit (HOSPITAL_COMMUNITY): Payer: Self-pay

## 2022-04-15 ENCOUNTER — Other Ambulatory Visit (HOSPITAL_COMMUNITY): Payer: Self-pay

## 2022-04-15 MED ORDER — TIZANIDINE HCL 4 MG PO TABS
4.0000 mg | ORAL_TABLET | Freq: Four times a day (QID) | ORAL | 0 refills | Status: DC | PRN
  Filled 2022-04-15: qty 40, 10d supply, fill #0

## 2022-04-17 ENCOUNTER — Other Ambulatory Visit (HOSPITAL_COMMUNITY): Payer: Self-pay

## 2022-04-18 ENCOUNTER — Other Ambulatory Visit (HOSPITAL_COMMUNITY): Payer: Self-pay

## 2022-04-20 ENCOUNTER — Other Ambulatory Visit (HOSPITAL_COMMUNITY): Payer: Self-pay

## 2022-04-20 MED ORDER — HYDROCODONE-ACETAMINOPHEN 5-325 MG PO TABS
1.0000 | ORAL_TABLET | ORAL | 0 refills | Status: DC
  Filled 2022-04-20: qty 20, 5d supply, fill #0

## 2022-04-22 ENCOUNTER — Other Ambulatory Visit (HOSPITAL_COMMUNITY): Payer: Self-pay

## 2022-04-22 ENCOUNTER — Other Ambulatory Visit: Payer: Self-pay

## 2022-04-24 ENCOUNTER — Other Ambulatory Visit: Payer: Self-pay

## 2022-04-24 ENCOUNTER — Other Ambulatory Visit (HOSPITAL_COMMUNITY): Payer: Self-pay

## 2022-04-24 DIAGNOSIS — M5416 Radiculopathy, lumbar region: Secondary | ICD-10-CM | POA: Diagnosis not present

## 2022-04-24 MED ORDER — TIZANIDINE HCL 4 MG PO TABS
4.0000 mg | ORAL_TABLET | Freq: Four times a day (QID) | ORAL | 0 refills | Status: DC | PRN
  Filled 2022-04-24: qty 40, 10d supply, fill #0

## 2022-04-30 ENCOUNTER — Other Ambulatory Visit (HOSPITAL_COMMUNITY): Payer: Self-pay

## 2022-05-01 DIAGNOSIS — M5416 Radiculopathy, lumbar region: Secondary | ICD-10-CM | POA: Diagnosis not present

## 2022-05-01 DIAGNOSIS — M6281 Muscle weakness (generalized): Secondary | ICD-10-CM | POA: Diagnosis not present

## 2022-05-02 ENCOUNTER — Other Ambulatory Visit (HOSPITAL_COMMUNITY): Payer: Self-pay

## 2022-05-04 ENCOUNTER — Other Ambulatory Visit (HOSPITAL_COMMUNITY): Payer: Self-pay

## 2022-05-05 ENCOUNTER — Other Ambulatory Visit (HOSPITAL_COMMUNITY): Payer: Self-pay

## 2022-05-06 ENCOUNTER — Other Ambulatory Visit: Payer: Self-pay

## 2022-05-06 ENCOUNTER — Other Ambulatory Visit (HOSPITAL_COMMUNITY): Payer: Self-pay

## 2022-05-06 MED ORDER — TIZANIDINE HCL 4 MG PO TABS
4.0000 mg | ORAL_TABLET | ORAL | 0 refills | Status: DC
  Filled 2022-05-06: qty 40, 10d supply, fill #0

## 2022-05-06 MED ORDER — MELOXICAM 15 MG PO TABS
15.0000 mg | ORAL_TABLET | Freq: Every day | ORAL | 0 refills | Status: DC | PRN
  Filled 2022-05-06: qty 30, 30d supply, fill #0

## 2022-05-07 ENCOUNTER — Other Ambulatory Visit (HOSPITAL_COMMUNITY): Payer: Self-pay

## 2022-05-07 MED ORDER — GABAPENTIN 300 MG PO CAPS
300.0000 mg | ORAL_CAPSULE | ORAL | 0 refills | Status: DC
  Filled 2022-05-07: qty 90, 34d supply, fill #0

## 2022-05-08 ENCOUNTER — Other Ambulatory Visit (HOSPITAL_COMMUNITY): Payer: Self-pay

## 2022-05-09 DIAGNOSIS — M5416 Radiculopathy, lumbar region: Secondary | ICD-10-CM | POA: Diagnosis not present

## 2022-05-09 DIAGNOSIS — M6281 Muscle weakness (generalized): Secondary | ICD-10-CM | POA: Diagnosis not present

## 2022-05-13 ENCOUNTER — Other Ambulatory Visit (HOSPITAL_COMMUNITY): Payer: Self-pay

## 2022-05-14 ENCOUNTER — Other Ambulatory Visit (HOSPITAL_COMMUNITY): Payer: Self-pay

## 2022-05-14 MED ORDER — TIZANIDINE HCL 4 MG PO TABS
4.0000 mg | ORAL_TABLET | Freq: Two times a day (BID) | ORAL | 0 refills | Status: DC | PRN
  Filled 2022-05-14: qty 40, 20d supply, fill #0

## 2022-05-15 DIAGNOSIS — M6281 Muscle weakness (generalized): Secondary | ICD-10-CM | POA: Diagnosis not present

## 2022-05-15 DIAGNOSIS — M5416 Radiculopathy, lumbar region: Secondary | ICD-10-CM | POA: Diagnosis not present

## 2022-05-17 DIAGNOSIS — M6281 Muscle weakness (generalized): Secondary | ICD-10-CM | POA: Diagnosis not present

## 2022-05-17 DIAGNOSIS — M5416 Radiculopathy, lumbar region: Secondary | ICD-10-CM | POA: Diagnosis not present

## 2022-05-22 DIAGNOSIS — M5416 Radiculopathy, lumbar region: Secondary | ICD-10-CM | POA: Diagnosis not present

## 2022-05-22 DIAGNOSIS — M6281 Muscle weakness (generalized): Secondary | ICD-10-CM | POA: Diagnosis not present

## 2022-05-22 DIAGNOSIS — Z9009 Acquired absence of other part of head and neck: Secondary | ICD-10-CM | POA: Diagnosis not present

## 2022-05-23 ENCOUNTER — Other Ambulatory Visit (HOSPITAL_COMMUNITY): Payer: Self-pay

## 2022-05-24 DIAGNOSIS — M6281 Muscle weakness (generalized): Secondary | ICD-10-CM | POA: Diagnosis not present

## 2022-05-24 DIAGNOSIS — M5416 Radiculopathy, lumbar region: Secondary | ICD-10-CM | POA: Diagnosis not present

## 2022-05-29 ENCOUNTER — Other Ambulatory Visit (HOSPITAL_COMMUNITY): Payer: Self-pay

## 2022-05-29 DIAGNOSIS — M6281 Muscle weakness (generalized): Secondary | ICD-10-CM | POA: Diagnosis not present

## 2022-05-29 DIAGNOSIS — M5416 Radiculopathy, lumbar region: Secondary | ICD-10-CM | POA: Diagnosis not present

## 2022-05-30 DIAGNOSIS — Z720 Tobacco use: Secondary | ICD-10-CM | POA: Diagnosis not present

## 2022-05-30 DIAGNOSIS — E119 Type 2 diabetes mellitus without complications: Secondary | ICD-10-CM | POA: Diagnosis not present

## 2022-05-30 DIAGNOSIS — F4321 Adjustment disorder with depressed mood: Secondary | ICD-10-CM | POA: Diagnosis not present

## 2022-05-30 MED ORDER — TIZANIDINE HCL 4 MG PO TABS
4.0000 mg | ORAL_TABLET | Freq: Two times a day (BID) | ORAL | 0 refills | Status: DC | PRN
  Filled 2022-05-30: qty 40, 20d supply, fill #0

## 2022-05-30 MED ORDER — MELOXICAM 15 MG PO TABS
15.0000 mg | ORAL_TABLET | Freq: Every day | ORAL | 0 refills | Status: DC | PRN
  Filled 2022-05-30: qty 30, 30d supply, fill #0

## 2022-05-31 ENCOUNTER — Other Ambulatory Visit (HOSPITAL_COMMUNITY): Payer: Self-pay

## 2022-05-31 ENCOUNTER — Ambulatory Visit
Admission: RE | Admit: 2022-05-31 | Discharge: 2022-05-31 | Disposition: A | Discharge status: Home / Self Care | Payer: PPO | Source: Ambulatory Visit | Attending: Family Medicine | Admitting: Family Medicine

## 2022-05-31 ENCOUNTER — Other Ambulatory Visit: Payer: Self-pay

## 2022-05-31 DIAGNOSIS — E2839 Other primary ovarian failure: Secondary | ICD-10-CM

## 2022-05-31 DIAGNOSIS — Z780 Asymptomatic menopausal state: Secondary | ICD-10-CM | POA: Diagnosis not present

## 2022-06-03 ENCOUNTER — Other Ambulatory Visit (HOSPITAL_COMMUNITY): Payer: Self-pay

## 2022-06-03 DIAGNOSIS — M6281 Muscle weakness (generalized): Secondary | ICD-10-CM | POA: Diagnosis not present

## 2022-06-03 DIAGNOSIS — M5416 Radiculopathy, lumbar region: Secondary | ICD-10-CM | POA: Diagnosis not present

## 2022-06-05 DIAGNOSIS — M6281 Muscle weakness (generalized): Secondary | ICD-10-CM | POA: Diagnosis not present

## 2022-06-05 DIAGNOSIS — M5416 Radiculopathy, lumbar region: Secondary | ICD-10-CM | POA: Diagnosis not present

## 2022-06-06 ENCOUNTER — Other Ambulatory Visit (HOSPITAL_COMMUNITY): Payer: Self-pay

## 2022-06-06 MED ORDER — GABAPENTIN 300 MG PO CAPS
300.0000 mg | ORAL_CAPSULE | ORAL | 0 refills | Status: DC
  Filled 2022-06-06: qty 90, 31d supply, fill #0

## 2022-06-07 ENCOUNTER — Other Ambulatory Visit (HOSPITAL_BASED_OUTPATIENT_CLINIC_OR_DEPARTMENT_OTHER): Payer: Self-pay

## 2022-06-07 ENCOUNTER — Other Ambulatory Visit (HOSPITAL_COMMUNITY): Payer: Self-pay

## 2022-06-11 ENCOUNTER — Other Ambulatory Visit (HOSPITAL_COMMUNITY): Payer: Self-pay

## 2022-06-11 MED ORDER — ROSUVASTATIN CALCIUM 5 MG PO TABS
5.0000 mg | ORAL_TABLET | Freq: Every day | ORAL | 1 refills | Status: DC
  Filled 2022-06-11: qty 90, 90d supply, fill #0
  Filled 2022-09-09: qty 90, 90d supply, fill #1

## 2022-06-12 DIAGNOSIS — M5416 Radiculopathy, lumbar region: Secondary | ICD-10-CM | POA: Diagnosis not present

## 2022-06-12 DIAGNOSIS — M6281 Muscle weakness (generalized): Secondary | ICD-10-CM | POA: Diagnosis not present

## 2022-06-14 DIAGNOSIS — M6281 Muscle weakness (generalized): Secondary | ICD-10-CM | POA: Diagnosis not present

## 2022-06-14 DIAGNOSIS — M5416 Radiculopathy, lumbar region: Secondary | ICD-10-CM | POA: Diagnosis not present

## 2022-06-19 ENCOUNTER — Other Ambulatory Visit (HOSPITAL_COMMUNITY): Payer: Self-pay

## 2022-06-26 ENCOUNTER — Other Ambulatory Visit (HOSPITAL_COMMUNITY): Payer: Self-pay

## 2022-06-28 ENCOUNTER — Other Ambulatory Visit (HOSPITAL_COMMUNITY): Payer: Self-pay

## 2022-06-28 MED ORDER — MELOXICAM 15 MG PO TABS
15.0000 mg | ORAL_TABLET | Freq: Every day | ORAL | 0 refills | Status: DC | PRN
  Filled 2022-06-28 (×3): qty 30, 30d supply, fill #0

## 2022-06-28 MED ORDER — TIZANIDINE HCL 4 MG PO TABS
4.0000 mg | ORAL_TABLET | Freq: Two times a day (BID) | ORAL | 0 refills | Status: DC | PRN
  Filled 2022-06-28 (×2): qty 40, 20d supply, fill #0

## 2022-07-01 ENCOUNTER — Other Ambulatory Visit: Payer: Self-pay

## 2022-07-02 ENCOUNTER — Other Ambulatory Visit (HOSPITAL_COMMUNITY): Payer: Self-pay

## 2022-07-06 ENCOUNTER — Other Ambulatory Visit (HOSPITAL_COMMUNITY): Payer: Self-pay

## 2022-07-09 ENCOUNTER — Other Ambulatory Visit: Payer: Self-pay

## 2022-07-13 ENCOUNTER — Other Ambulatory Visit (HOSPITAL_COMMUNITY): Payer: Self-pay

## 2022-07-15 ENCOUNTER — Other Ambulatory Visit (HOSPITAL_COMMUNITY): Payer: Self-pay

## 2022-07-15 MED ORDER — GABAPENTIN 300 MG PO CAPS
300.0000 mg | ORAL_CAPSULE | ORAL | 0 refills | Status: DC
  Filled 2022-07-15: qty 90, 30d supply, fill #0

## 2022-07-17 ENCOUNTER — Other Ambulatory Visit (HOSPITAL_COMMUNITY): Payer: Self-pay

## 2022-07-19 ENCOUNTER — Other Ambulatory Visit (HOSPITAL_COMMUNITY): Payer: Self-pay

## 2022-07-20 ENCOUNTER — Other Ambulatory Visit (HOSPITAL_COMMUNITY): Payer: Self-pay

## 2022-07-23 ENCOUNTER — Other Ambulatory Visit (HOSPITAL_COMMUNITY): Payer: Self-pay

## 2022-07-23 MED ORDER — TIZANIDINE HCL 4 MG PO TABS
4.0000 mg | ORAL_TABLET | Freq: Two times a day (BID) | ORAL | 0 refills | Status: DC | PRN
  Filled 2022-07-23 (×2): qty 40, 20d supply, fill #0

## 2022-07-29 ENCOUNTER — Other Ambulatory Visit (HOSPITAL_COMMUNITY): Payer: Self-pay

## 2022-07-30 MED ORDER — MELOXICAM 15 MG PO TABS
15.0000 mg | ORAL_TABLET | Freq: Every day | ORAL | 0 refills | Status: DC | PRN
  Filled 2022-07-30 – 2022-07-31 (×2): qty 30, 30d supply, fill #0

## 2022-07-31 ENCOUNTER — Other Ambulatory Visit (HOSPITAL_COMMUNITY): Payer: Self-pay

## 2022-08-12 ENCOUNTER — Other Ambulatory Visit (HOSPITAL_COMMUNITY): Payer: Self-pay

## 2022-08-14 ENCOUNTER — Other Ambulatory Visit (HOSPITAL_COMMUNITY): Payer: Self-pay

## 2022-08-14 MED ORDER — TIZANIDINE HCL 4 MG PO TABS
4.0000 mg | ORAL_TABLET | Freq: Two times a day (BID) | ORAL | 0 refills | Status: DC | PRN
  Filled 2022-08-14: qty 40, 20d supply, fill #0

## 2022-08-16 ENCOUNTER — Ambulatory Visit
Admission: RE | Admit: 2022-08-16 | Discharge: 2022-08-16 | Disposition: A | Discharge status: Home / Self Care | Payer: PPO | Source: Ambulatory Visit | Attending: Family Medicine | Admitting: Family Medicine

## 2022-08-16 ENCOUNTER — Other Ambulatory Visit (HOSPITAL_COMMUNITY): Payer: Self-pay

## 2022-08-16 DIAGNOSIS — F17210 Nicotine dependence, cigarettes, uncomplicated: Secondary | ICD-10-CM

## 2022-08-16 DIAGNOSIS — Z122 Encounter for screening for malignant neoplasm of respiratory organs: Secondary | ICD-10-CM

## 2022-08-16 DIAGNOSIS — Z87891 Personal history of nicotine dependence: Secondary | ICD-10-CM

## 2022-08-19 ENCOUNTER — Other Ambulatory Visit (HOSPITAL_COMMUNITY): Payer: Self-pay

## 2022-08-21 ENCOUNTER — Other Ambulatory Visit (HOSPITAL_COMMUNITY): Payer: Self-pay

## 2022-08-21 MED ORDER — GABAPENTIN 300 MG PO CAPS
300.0000 mg | ORAL_CAPSULE | ORAL | 0 refills | Status: DC
  Filled 2022-08-21: qty 90, 32d supply, fill #0

## 2022-08-23 ENCOUNTER — Other Ambulatory Visit: Payer: Self-pay | Admitting: Acute Care

## 2022-08-23 ENCOUNTER — Other Ambulatory Visit: Payer: Self-pay

## 2022-08-23 DIAGNOSIS — Z87891 Personal history of nicotine dependence: Secondary | ICD-10-CM

## 2022-08-23 DIAGNOSIS — Z122 Encounter for screening for malignant neoplasm of respiratory organs: Secondary | ICD-10-CM

## 2022-08-23 DIAGNOSIS — F17210 Nicotine dependence, cigarettes, uncomplicated: Secondary | ICD-10-CM

## 2022-08-27 ENCOUNTER — Other Ambulatory Visit (HOSPITAL_COMMUNITY): Payer: Self-pay

## 2022-08-28 ENCOUNTER — Other Ambulatory Visit (HOSPITAL_COMMUNITY): Payer: Self-pay

## 2022-08-29 ENCOUNTER — Other Ambulatory Visit (HOSPITAL_COMMUNITY): Payer: Self-pay

## 2022-09-02 ENCOUNTER — Other Ambulatory Visit (HOSPITAL_COMMUNITY): Payer: Self-pay

## 2022-09-03 ENCOUNTER — Other Ambulatory Visit: Payer: Self-pay

## 2022-09-03 ENCOUNTER — Other Ambulatory Visit (HOSPITAL_COMMUNITY): Payer: Self-pay

## 2022-09-03 MED ORDER — MELOXICAM 15 MG PO TABS
15.0000 mg | ORAL_TABLET | Freq: Every day | ORAL | 0 refills | Status: DC | PRN
  Filled 2022-09-03: qty 30, 30d supply, fill #0

## 2022-09-05 ENCOUNTER — Other Ambulatory Visit (HOSPITAL_COMMUNITY): Payer: Self-pay

## 2022-09-10 ENCOUNTER — Other Ambulatory Visit: Payer: Self-pay

## 2022-09-13 ENCOUNTER — Other Ambulatory Visit (HOSPITAL_COMMUNITY): Payer: Self-pay

## 2022-09-13 MED ORDER — TIZANIDINE HCL 4 MG PO TABS
4.0000 mg | ORAL_TABLET | Freq: Two times a day (BID) | ORAL | 0 refills | Status: DC | PRN
  Filled 2022-09-13: qty 40, 20d supply, fill #0

## 2022-09-17 ENCOUNTER — Other Ambulatory Visit (HOSPITAL_COMMUNITY): Payer: Self-pay

## 2022-09-30 ENCOUNTER — Other Ambulatory Visit (HOSPITAL_COMMUNITY): Payer: Self-pay

## 2022-10-02 ENCOUNTER — Other Ambulatory Visit (HOSPITAL_COMMUNITY): Payer: Self-pay

## 2022-10-03 ENCOUNTER — Other Ambulatory Visit: Payer: Self-pay

## 2022-10-03 ENCOUNTER — Other Ambulatory Visit (HOSPITAL_COMMUNITY): Payer: Self-pay

## 2022-10-03 MED ORDER — GABAPENTIN 300 MG PO CAPS
300.0000 mg | ORAL_CAPSULE | Freq: Three times a day (TID) | ORAL | 0 refills | Status: DC
  Filled 2022-10-03: qty 90, 30d supply, fill #0

## 2022-10-03 MED ORDER — TIZANIDINE HCL 4 MG PO TABS
4.0000 mg | ORAL_TABLET | Freq: Two times a day (BID) | ORAL | 0 refills | Status: DC | PRN
  Filled 2022-10-03: qty 40, 20d supply, fill #0

## 2022-10-03 MED ORDER — MELOXICAM 15 MG PO TABS
15.0000 mg | ORAL_TABLET | Freq: Every day | ORAL | 0 refills | Status: DC | PRN
  Filled 2022-10-03: qty 30, 30d supply, fill #0

## 2022-10-09 DIAGNOSIS — L298 Other pruritus: Secondary | ICD-10-CM | POA: Diagnosis not present

## 2022-10-09 DIAGNOSIS — L820 Inflamed seborrheic keratosis: Secondary | ICD-10-CM | POA: Diagnosis not present

## 2022-10-09 DIAGNOSIS — L821 Other seborrheic keratosis: Secondary | ICD-10-CM | POA: Diagnosis not present

## 2022-10-09 DIAGNOSIS — L814 Other melanin hyperpigmentation: Secondary | ICD-10-CM | POA: Diagnosis not present

## 2022-10-09 DIAGNOSIS — L538 Other specified erythematous conditions: Secondary | ICD-10-CM | POA: Diagnosis not present

## 2022-10-11 ENCOUNTER — Other Ambulatory Visit (HOSPITAL_COMMUNITY): Payer: Self-pay

## 2022-10-23 ENCOUNTER — Other Ambulatory Visit (HOSPITAL_COMMUNITY): Payer: Self-pay

## 2022-10-29 ENCOUNTER — Other Ambulatory Visit (HOSPITAL_COMMUNITY): Payer: Self-pay

## 2022-10-29 MED ORDER — TIZANIDINE HCL 4 MG PO TABS
4.0000 mg | ORAL_TABLET | Freq: Two times a day (BID) | ORAL | 0 refills | Status: DC | PRN
  Filled 2022-10-29: qty 40, 20d supply, fill #0

## 2022-11-03 ENCOUNTER — Other Ambulatory Visit (HOSPITAL_COMMUNITY): Payer: Self-pay

## 2022-11-04 ENCOUNTER — Other Ambulatory Visit (HOSPITAL_COMMUNITY): Payer: Self-pay

## 2022-11-04 MED ORDER — MELOXICAM 15 MG PO TABS
15.0000 mg | ORAL_TABLET | Freq: Every day | ORAL | 0 refills | Status: DC | PRN
  Filled 2022-11-04: qty 30, 30d supply, fill #0

## 2022-11-05 ENCOUNTER — Other Ambulatory Visit (HOSPITAL_COMMUNITY): Payer: Self-pay

## 2022-11-19 ENCOUNTER — Other Ambulatory Visit (HOSPITAL_COMMUNITY): Payer: Self-pay

## 2022-11-19 MED ORDER — TIZANIDINE HCL 4 MG PO TABS
4.0000 mg | ORAL_TABLET | Freq: Two times a day (BID) | ORAL | 0 refills | Status: DC | PRN
  Filled 2022-11-19: qty 60, 30d supply, fill #0

## 2022-11-20 ENCOUNTER — Other Ambulatory Visit: Payer: Self-pay

## 2022-12-03 ENCOUNTER — Other Ambulatory Visit: Payer: Self-pay

## 2022-12-03 ENCOUNTER — Other Ambulatory Visit (HOSPITAL_COMMUNITY): Payer: Self-pay

## 2022-12-03 DIAGNOSIS — Z720 Tobacco use: Secondary | ICD-10-CM | POA: Diagnosis not present

## 2022-12-03 DIAGNOSIS — I2510 Atherosclerotic heart disease of native coronary artery without angina pectoris: Secondary | ICD-10-CM | POA: Diagnosis not present

## 2022-12-03 DIAGNOSIS — Z0000 Encounter for general adult medical examination without abnormal findings: Secondary | ICD-10-CM | POA: Diagnosis not present

## 2022-12-03 DIAGNOSIS — E119 Type 2 diabetes mellitus without complications: Secondary | ICD-10-CM | POA: Diagnosis not present

## 2022-12-03 DIAGNOSIS — Z1211 Encounter for screening for malignant neoplasm of colon: Secondary | ICD-10-CM | POA: Diagnosis not present

## 2022-12-03 DIAGNOSIS — E782 Mixed hyperlipidemia: Secondary | ICD-10-CM | POA: Diagnosis not present

## 2022-12-03 DIAGNOSIS — Z23 Encounter for immunization: Secondary | ICD-10-CM | POA: Diagnosis not present

## 2022-12-03 MED ORDER — MELOXICAM 15 MG PO TABS
15.0000 mg | ORAL_TABLET | Freq: Every day | ORAL | 0 refills | Status: DC | PRN
  Filled 2022-12-03: qty 30, 30d supply, fill #0

## 2022-12-11 DIAGNOSIS — Z1211 Encounter for screening for malignant neoplasm of colon: Secondary | ICD-10-CM | POA: Diagnosis not present

## 2022-12-19 ENCOUNTER — Other Ambulatory Visit: Payer: Self-pay | Admitting: Medical Genetics

## 2022-12-19 ENCOUNTER — Ambulatory Visit: Payer: PPO | Admitting: Podiatry

## 2022-12-19 ENCOUNTER — Other Ambulatory Visit (HOSPITAL_COMMUNITY): Payer: Self-pay

## 2022-12-19 ENCOUNTER — Encounter: Payer: Self-pay | Admitting: Podiatry

## 2022-12-19 DIAGNOSIS — M79675 Pain in left toe(s): Secondary | ICD-10-CM

## 2022-12-19 DIAGNOSIS — E119 Type 2 diabetes mellitus without complications: Secondary | ICD-10-CM | POA: Diagnosis not present

## 2022-12-19 DIAGNOSIS — B351 Tinea unguium: Secondary | ICD-10-CM | POA: Diagnosis not present

## 2022-12-19 DIAGNOSIS — M79674 Pain in right toe(s): Secondary | ICD-10-CM

## 2022-12-19 DIAGNOSIS — Z006 Encounter for examination for normal comparison and control in clinical research program: Secondary | ICD-10-CM

## 2022-12-19 NOTE — Progress Notes (Signed)
This patient presents to the office with chief complaint of long thick nails and diabetic feet.  This patient  says there  is  no pain and discomfort in her  feet.  This patient says there are long thick painful nails.  These nails are painful walking and wearing shoes.  Patient has no history of infection or drainage from both feet.  Patient is unable to  self treat his own nails . This patient presents  to the office today for treatment of the  long nails and a foot evaluation due to history of  diabetes. ? ?General Appearance  Alert, conversant and in no acute stress. ? ?Vascular  Dorsalis pedis and posterior tibial  pulses are palpable  bilaterally.  Capillary return is within normal limits  bilaterally. Temperature is within normal limits  bilaterally. ? ?Neurologic  Senn-Weinstein monofilament wire test within normal limits  bilaterally. Muscle power within normal limits bilaterally. ? ?Nails Thick disfigured discolored nails with subungual debris  from hallux to fifth toes bilaterally. No evidence of bacterial infection or drainage bilaterally. ? ?Orthopedic  No limitations of motion of motion feet .  No crepitus or effusions noted.  No bony pathology or digital deformities noted. ? ?Skin  normotropic skin with no porokeratosis noted bilaterally.  No signs of infections or ulcers noted.    ? ?Onychomycosis  Diabetes with no foot complications ? ?IE  Debride nails x 10.  A diabetic foot exam was performed and there is no evidence of any vascular or neurologic pathology.   RTC 3 months. ? ? ?Aldyn Toon DPM   ?

## 2022-12-20 ENCOUNTER — Other Ambulatory Visit (HOSPITAL_COMMUNITY): Payer: Self-pay

## 2022-12-20 ENCOUNTER — Other Ambulatory Visit: Payer: Self-pay

## 2022-12-20 MED ORDER — ROSUVASTATIN CALCIUM 5 MG PO TABS
5.0000 mg | ORAL_TABLET | Freq: Every day | ORAL | 1 refills | Status: DC
  Filled 2022-12-20: qty 90, 90d supply, fill #0

## 2023-01-02 ENCOUNTER — Ambulatory Visit: Payer: PPO | Attending: Cardiology | Admitting: Cardiology

## 2023-01-02 ENCOUNTER — Encounter: Payer: Self-pay | Admitting: Cardiology

## 2023-01-02 VITALS — BP 148/70 | HR 88 | Resp 16 | Ht 63.0 in | Wt 186.2 lb

## 2023-01-02 DIAGNOSIS — I700 Atherosclerosis of aorta: Secondary | ICD-10-CM

## 2023-01-02 DIAGNOSIS — F17210 Nicotine dependence, cigarettes, uncomplicated: Secondary | ICD-10-CM

## 2023-01-02 DIAGNOSIS — E1165 Type 2 diabetes mellitus with hyperglycemia: Secondary | ICD-10-CM

## 2023-01-02 DIAGNOSIS — I2510 Atherosclerotic heart disease of native coronary artery without angina pectoris: Secondary | ICD-10-CM

## 2023-01-02 DIAGNOSIS — I2584 Coronary atherosclerosis due to calcified coronary lesion: Secondary | ICD-10-CM | POA: Diagnosis not present

## 2023-01-02 MED ORDER — ASPIRIN 81 MG PO TBEC: 81.0000 mg | DELAYED_RELEASE_TABLET | Freq: Every day | ORAL | Status: AC

## 2023-01-02 NOTE — Progress Notes (Signed)
Cardiology Office Note:    Date:  01/02/2023  NAME:  Melissa Jimenez    MRN: [id number] DOB:  1950/08/03   PCP:  Shon Hale, MD  Former Cardiology Providers: None Primary Cardiologist:  Tessa Lerner, DO, [facility] (established care 01/02/2023) Electrophysiologist:  None   Referring MD: Shon Hale, *  Reason of Consult: Evaluation for coronary artery disease  Chief Complaint  Patient presents with   Coronary Artery Disease   New Patient (Initial Visit)    History of Present Illness:    Melissa Jimenez is a 72 y.o. Caucasian female whose past medical history and cardiovascular risk factors includes: Coronary artery calcification (nongated CT scan in June 2024), aortic atherosclerosis, cigarette smoking, NIDDM. She is being seen today for the evaluation of CAD (recently noted to have CAC on non-gated CT Scan) at the request of Shon Hale, *.  Patient recently had a CT of the chest for lung cancer screening in June 2024 and was noted to have both aortic and coronary artery calcification.  She is referred to cardiology for further risk assessment.  Clinically she denies anginal chest pain or heart failure symptoms.  No structured exercise program.  But she does work in her yard at least 1 hour a day and she enjoys doing it.  No change in overall physical endurance.  Current Medications: Current Meds  Medication Sig   aspirin EC 81 MG tablet Take 1 tablet (81 mg total) by mouth daily. Swallow whole.   Blood Glucose Monitoring Suppl (ONETOUCH VERIO FLEX SYSTEM) w/Device KIT Use to check blood sugar 2 times a day   buPROPion (WELLBUTRIN XL) 150 MG 24 hr tablet Take 1 tablet by mouth daily in the morning   Calcium Carb-Cholecalciferol (CALCIUM + D3 PO) Take 1 tablet by mouth every evening.   carboxymethylcellulose (REFRESH TEARS) 0.5 % SOLN Place 1 drop into both eyes in the morning.   gabapentin (NEURONTIN) 300 MG capsule Take 1 capsule by mouth  on day 1. Then take 1 capsule twice daily on day 2. Then take 1 capsule 3 times daily thereafter. Taper over 7 days to stop medication as directed.   glucose blood test strip Use to check blood sugar daily   HYDROcodone-acetaminophen (NORCO/VICODIN) 5-325 MG tablet Take 1 tablet by mouth every six to eight hours as needed for pain   ibuprofen (ADVIL) 200 MG tablet Take 600 mg by mouth 2 (two) times daily as needed (pain.).   Lancets (ONETOUCH DELICA PLUS LANCET33G) MISC Use daily as directed   meloxicam (MOBIC) 15 MG tablet Take 1 tablet (15 mg total) by mouth daily as needed for pain   metFORMIN (GLUCOPHAGE-XR) 500 MG 24 hr tablet Take 1 tablet (500 mg total) by mouth 2 (two) times daily with a meal. (Patient taking differently: Take 1,000 mg by mouth every evening.)   rosuvastatin (CRESTOR) 5 MG tablet Take 1 tablet by mouth once a day at bedtime   tiZANidine (ZANAFLEX) 4 MG tablet Take 1 tablet (4 mg total) by mouth every 6 (six) to 8 (eight) hours as needed.   traMADol (ULTRAM) 50 MG tablet Take 1 - 2 tablets (50 - 100 mg total) by mouth every 6 (six) hours as needed for moderate pain.     Allergies:    Patient has no known allergies.   Past Medical History: Past Medical History:  Diagnosis Date   Anxiety    Arthritis    Diabetes mellitus without complication (HCC)  Past Surgical History: Past Surgical History:  Procedure Laterality Date   CATARACT EXTRACTION     THYROID LOBECTOMY Left 03/28/2022   Procedure: LEFT THYROID LOBECTOMY;  Surgeon: Darnell Level, MD;  Location: [facility];  Service: General;  Laterality: Left;   WISDOM TOOTH EXTRACTION      Social History: Social History   Tobacco Use   Smoking status: Every Day    Current packs/day: 0.50    Average packs/day: 0.5 packs/day for 47.0 years (23.5 ttl pk-yrs)    Types: Cigarettes   Smokeless tobacco: Never  Vaping Use   Vaping status: Never Used  Substance Use Topics   Alcohol use: No    Alcohol/week: 0.0  standard drinks of alcohol   Drug use: No    Family History: Family History  Problem Relation Age of Onset   Cancer Mother    Cancer Father    Cancer Sister    Hyperlipidemia Daughter     ROS:   Review of Systems  Cardiovascular:  Negative for chest pain, claudication, dyspnea on exertion, irregular heartbeat, leg swelling, near-syncope, orthopnea, palpitations, paroxysmal nocturnal dyspnea and syncope.  Respiratory:  Negative for shortness of breath.   Hematologic/Lymphatic: Negative for bleeding problem.  Musculoskeletal:  Positive for back pain. Negative for muscle cramps and myalgias.  Neurological:  Negative for dizziness and light-headedness.       Radicular pain    EKGs/Labs/Other Studies Reviewed:   EKG Interpretation Date/Time:  Thursday January 02 2023 13:13:53 EDT Ventricular Rate:  88 PR Interval:  184 QRS Duration:  66 QT Interval:  352 QTC Calculation: 425 R Axis:   11  Text Interpretation: Sinus rhythm with marked sinus arrhythmia Low voltage QRS Cannot rule out Anterior infarct (cited on or before 12-Mar-2022) When compared with ECG of 12-Mar-2022 09:51, Atrial premature contraction NO LONGER PRESENT Confirmed by Tessa Lerner ([id number]) on 01/02/2023 1:34:43 PM    RADIOLOGY: CT scan lung cancer screening: June 2024: 1. Lung-RADS 2S, benign appearance or behavior. Continue annual screening with low-dose chest CT without contrast in 12 months.  2. The "S" modifier above refers to potentially clinically significant non lung cancer related findings. Specifically, there is aortic atherosclerosis, in addition to left main and 2 vessel coronary artery disease. Please note that although the presence of coronary artery calcium documents the presence of coronary artery disease, the severity of this disease and any potential stenosis cannot be assessed on this non-gated CT examination. Assessment for potential risk factor modification, dietary therapy or pharmacologic  therapy may be warranted, if clinically indicated. 3. Mild diffuse bronchial wall thickening with mild centrilobular and paraseptal emphysema; imaging findings suggestive of underlying COPD. Aortic Atherosclerosis (ICD10-I70.0) and Emphysema (ICD10-J43.9).   Labs:    Latest Ref Rng & Units 03/12/2022    9:50 AM  CBC  WBC 4.0 - 10.5 K/uL 12.2   Hemoglobin 12.0 - 15.0 g/dL 02.7   Hematocrit 25.3 - 46.0 % 44.6   Platelets 150 - 400 K/uL 298        Latest Ref Rng & Units 03/12/2022    9:50 AM  BMP  Glucose 70 - 99 mg/dL 664   BUN 8 - 23 mg/dL 18   Creatinine 4.03 - 1.00 mg/dL 4.74   Sodium 259 - 563 mmol/L 137   Potassium 3.5 - 5.1 mmol/L 4.1   Chloride 98 - 111 mmol/L 103   CO2 22 - 32 mmol/L 25   Calcium 8.9 - 10.3 mg/dL 9.0  Latest Ref Rng & Units 03/12/2022    9:50 AM  CMP  Glucose 70 - 99 mg/dL 643   BUN 8 - 23 mg/dL 18   Creatinine 3.29 - 1.00 mg/dL 5.18   Sodium 841 - 660 mmol/L 137   Potassium 3.5 - 5.1 mmol/L 4.1   Chloride 98 - 111 mmol/L 103   CO2 22 - 32 mmol/L 25   Calcium 8.9 - 10.3 mg/dL 9.0     No results found for: "CHOL", "HDL", "LDLCALC", "LDLDIRECT", "TRIG", "CHOLHDL" No results for input(s): "LIPOA" in the last 8760 hours. No components found for: "NTPROBNP" No results for input(s): "PROBNP" in the last 8760 hours. No results for input(s): "TSH" in the last 8760 hours.  External Labs: Collected: December 03, 2022 provided by referring physician: Total cholesterol 149, triglycerides 271, HDL 48, calculated LDL 58, non-HDL 101. BUN 8, creatinine 0.71 Hemoglobin and hematocrit within acceptable limits.  Physical Exam:    Today's Vitals   01/02/23 1309  BP: (!) 148/70  Pulse: 88  Resp: 16  SpO2: 96%  Weight: 186 lb 3.2 oz (84.5 kg)  Height: 5\' 3"  (1.6 m)   Body mass index is 32.98 kg/m. Wt Readings from Last 3 Encounters:  01/02/23 186 lb 3.2 oz (84.5 kg)  03/28/22 205 lb (93 kg)  03/12/22 205 lb (93 kg)    Physical Exam   Constitutional: No distress.  hemodynamically stable  Neck: No JVD present.  Cardiovascular: Normal rate, regular rhythm, S1 normal, S2 normal, intact distal pulses and normal pulses. Exam reveals no gallop, no S3 and no S4.  No murmur heard. Pulmonary/Chest: Effort normal and breath sounds normal. No stridor. She has no wheezes. She has no rales.  Abdominal: Soft. Bowel sounds are normal. She exhibits no distension. There is no abdominal tenderness.  Musculoskeletal:        General: No edema.     Cervical back: Neck supple.  Neurological: She is alert and oriented to person, place, and time. She has intact cranial nerves (2-12).  Skin: Skin is warm and moist.     Impression & Recommendation(s):  Impression:   ICD-10-CM   1. Coronary atherosclerosis due to calcified coronary lesion  I25.10 EKG 12-Lead   I25.84 ECHOCARDIOGRAM COMPLETE    MYOCARDIAL PERFUSION IMAGING    Cardiac Stress Test: Informed Consent Details: Physician/Practitioner Attestation; Transcribe to consent form and obtain patient signature    2. Aortic atherosclerosis (HCC)  I70.0 ECHOCARDIOGRAM COMPLETE    MYOCARDIAL PERFUSION IMAGING    Cardiac Stress Test: Informed Consent Details: Physician/Practitioner Attestation; Transcribe to consent form and obtain patient signature    3. Cigarette smoker  F17.210     4. Type 2 diabetes mellitus with hyperglycemia, without long-term current use of insulin (HCC)  E11.65 ECHOCARDIOGRAM COMPLETE    MYOCARDIAL PERFUSION IMAGING    Cardiac Stress Test: Informed Consent Details: Physician/Practitioner Attestation; Transcribe to consent form and obtain patient signature       Recommendation(s):  Coronary atherosclerosis due to calcified coronary lesion Denies anginal chest pain or heart failure symptoms. EKG is nonischemic. Echo will be ordered to evaluate for structural heart disease and left ventricular systolic function. Pharmacological nuclear stress test to evaluate for  functional capacity and reversible ischemia in the setting of multivessel coronary calcification she is not able to exercise due to back / radicular pain. CT scan independently reviewed-images shown to the patient. Start aspirin 81 mg p.o. daily. Continue Crestor 5 mg p.o. nightly, most recent lipids from  September 2024 illustrate LDL at 58 mg/dL. Reemphasized the importance of secondary prevention with focus on improving her modifiable cardiovascular risk factors such as glycemic control, lipid management, blood pressure control, weight loss.  Aortic atherosclerosis (HCC) Management as discussed above  Cigarette smoker Tobacco cessation counseling: Currently smoking 0.5 packs/day   Patient denies claudication Consider screening for peripheral artery disease with ankle-brachial index-defer to PCP.  She is informed of the dangers of tobacco abuse including stroke, cancer, and MI, as well as benefits of tobacco cessation. She is willing to quit at this time -but finds it difficult. 7 mins were spent counseling patient cessation techniques. We discussed various methods to help quit smoking, including deciding on a date to quit, joining a support group, pharmacological agents- nicotine gum/patch/lozenges.  I will reassess her progress at the next follow-up visit  Type 2 diabetes mellitus with hyperglycemia, without long-term current use of insulin (HCC) Currently on Metformin 1000 mg p.o. twice daily. Blood pressure at today's office visit is not well-controlled.  I have asked her to keep a log of her blood pressures at home. In the setting of diabetes mellitus type 2 and elevated blood pressures would recommend either ACE inhibitor's or ARB for renal protection.  To be further discussed with PCP. Continue statin therapy at the current dose, LDL within favorable limits. Patient understands importance of glycemic control.  Orders Placed:  Orders Placed This Encounter  Procedures   Cardiac  Stress Test: Informed Consent Details: Physician/Practitioner Attestation; Transcribe to consent form and obtain patient signature    Order Specific Question:   Physician/Practitioner attestation of informed consent for procedure/surgical case    Answer:   I, the physician/practitioner, attest that I have discussed with the patient the benefits, risks, side effects, alternatives, likelihood of achieving goals and potential problems during recovery for the procedure that I have provided informed consent.    Order Specific Question:   Procedure    Answer:   Lexiscan Myocardial Perfusion Imaging    Order Specific Question:   Indication/Reason    Answer:   Coronary atherosclerosis, aortic atherosclerosis   MYOCARDIAL PERFUSION IMAGING    Standing Status:   Future    Standing Expiration Date:   01/02/2024    Order Specific Question:   Patient weight in lbs    Answer:   186    Order Specific Question:   Where should this be performed?    Answer:   [facility]    Order Specific Question:   Type of stress    Answer:   Lexiscan   EKG 12-Lead   ECHOCARDIOGRAM COMPLETE    Standing Status:   Future    Standing Expiration Date:   01/02/2024    Order Specific Question:   Where should this test be performed    Answer:   [facility])    Order Specific Question:   Does the patient weigh less than or greater than 250 lbs?    Answer:   Patient weighs less than 250 lbs    Order Specific Question:   Perflutren DEFINITY (image enhancing agent) should be administered unless hypersensitivity or allergy exist    Answer:   Administer Perflutren    Order Specific Question:   Reason for exam-Echo    Answer:   Other-Full Diagnosis List    Order Specific Question:   Full ICD-10/Reason for Exam    Answer:   Coronary atherosclerosis [409811]    Order Specific Question:  Full ICD-10/Reason for Exam    Answer:   Aortic atherosclerosis (HCC) [232769]   As part of  medical decision making results of the provider notes from referring physician, outside labs from September 2024, EKG were reviewed independently at today's visit.   Final Medication List:    Meds ordered this encounter  Medications   aspirin EC 81 MG tablet    Sig: Take 1 tablet (81 mg total) by mouth daily. Swallow whole.     Current Outpatient Medications:    aspirin EC 81 MG tablet, Take 1 tablet (81 mg total) by mouth daily. Swallow whole., Disp: , Rfl:    Blood Glucose Monitoring Suppl (ONETOUCH VERIO FLEX SYSTEM) w/Device KIT, Use to check blood sugar 2 times a day, Disp: 1 kit, Rfl: 0   buPROPion (WELLBUTRIN XL) 150 MG 24 hr tablet, Take 1 tablet by mouth daily in the morning, Disp: 90 tablet, Rfl: 3   Calcium Carb-Cholecalciferol (CALCIUM + D3 PO), Take 1 tablet by mouth every evening., Disp: , Rfl:    carboxymethylcellulose (REFRESH TEARS) 0.5 % SOLN, Place 1 drop into both eyes in the morning., Disp: , Rfl:    gabapentin (NEURONTIN) 300 MG capsule, Take 1 capsule by mouth on day 1. Then take 1 capsule twice daily on day 2. Then take 1 capsule 3 times daily thereafter. Taper over 7 days to stop medication as directed., Disp: 90 capsule, Rfl: 0   glucose blood test strip, Use to check blood sugar daily, Disp: 100 each, Rfl: 0   HYDROcodone-acetaminophen (NORCO/VICODIN) 5-325 MG tablet, Take 1 tablet by mouth every six to eight hours as needed for pain, Disp: 20 tablet, Rfl: 0   ibuprofen (ADVIL) 200 MG tablet, Take 600 mg by mouth 2 (two) times daily as needed (pain.)., Disp: , Rfl:    Lancets (ONETOUCH DELICA PLUS LANCET33G) MISC, Use daily as directed, Disp: 100 each, Rfl: 0   meloxicam (MOBIC) 15 MG tablet, Take 1 tablet (15 mg total) by mouth daily as needed for pain, Disp: 30 tablet, Rfl: 0   metFORMIN (GLUCOPHAGE-XR) 500 MG 24 hr tablet, Take 1 tablet (500 mg total) by mouth 2 (two) times daily with a meal. (Patient taking differently: Take 1,000 mg by mouth every evening.),  Disp: 180 tablet, Rfl: 3   rosuvastatin (CRESTOR) 5 MG tablet, Take 1 tablet by mouth once a day at bedtime, Disp: 90 tablet, Rfl: 1   tiZANidine (ZANAFLEX) 4 MG tablet, Take 1 tablet (4 mg total) by mouth every 6 (six) to 8 (eight) hours as needed., Disp: 40 tablet, Rfl: 0   traMADol (ULTRAM) 50 MG tablet, Take 1 - 2 tablets (50 - 100 mg total) by mouth every 6 (six) hours as needed for moderate pain., Disp: 15 tablet, Rfl: 0  Consent:   Informed Consent   Shared Decision Making/Informed Consent The risks [chest pain, shortness of breath, cardiac arrhythmias, dizziness, blood pressure fluctuations, myocardial infarction, stroke/transient ischemic attack, nausea, vomiting, allergic reaction, radiation exposure, metallic taste sensation and life-threatening complications (estimated to be 1 in 10,000)], benefits (risk stratification, diagnosing coronary artery disease, treatment guidance) and alternatives of a nuclear stress test were discussed in detail with Ms. Hanover and she agrees to proceed.     Disposition:   6 month sooner if needed.  Patient may be asked to follow-up sooner based on the results of the above-mentioned testing.  Her questions and concerns were addressed to her satisfaction. She voices understanding of the recommendations provided during this  encounter.    Signed, Tessa Lerner, DO, [facility]  [facility]  [address] [place], [place] [address] 01/02/2023 2:49 PM

## 2023-01-02 NOTE — Patient Instructions (Addendum)
Medication Instructions:  Your physician has recommended you make the following change in your medication:   1) START aspirin 81 mg once daily (may purchase over the counter)  *If you need a refill on your cardiac medications before your next appointment, please call your pharmacy*  Lab Work: None ordered today.  Testing/Procedures: Your physician has requested that you have an echocardiogram. Echocardiography is a painless test that uses sound waves to create images of your heart. It provides your doctor with information about the size and shape of your heart and how well your heart's chambers and valves are working. This procedure takes approximately one hour. There are no restrictions for this procedure. Please do NOT wear cologne, perfume, aftershave, or lotions (deodorant is allowed). Please arrive 15 minutes prior to your appointment time.  Your physician has requested that you have a lexiscan myoview. For further information please visit [email or web address] Please follow instruction sheet, as given.   Follow-Up: At [facility], you and your health needs are our priority.  As part of our continuing mission to provide you with exceptional heart care, we have created designated Provider Care Teams.  These Care Teams include your primary Cardiologist (physician) and Advanced Practice Providers (APPs -  Physician Assistants and Nurse Practitioners) who all work together to provide you with the care you need, when you need it.  We recommend signing up for the patient portal called "MyChart".  Sign up information is provided on this After Visit Summary.  MyChart is used to connect with patients for Virtual Visits (Telemedicine).  Patients are able to view lab/test results, encounter notes, upcoming appointments, etc.  Non-urgent messages can be sent to your provider as well.   To learn more about what you can do with MyChart, go to [email or web address].    Your next appointment:   6  month(s)  The format for your next appointment:   In Person  Provider:   Tessa Lerner, DO {  Other Instructions Lexiscan Myoview (Stress Test) Instructions  Please arrive 15 minutes prior to your appointment time for registration and insurance purposes.   The test will take approximately 3 to 4 hours to complete; you may bring reading material.  If someone comes with you to your appointment, they will need to remain in the main lobby due to limited space in the testing area.    How to prepare for your Myocardial Perfusion Test: Do not eat or drink 3 hours prior to your test, except you may have water. Do not consume products containing caffeine (regular or decaffeinated) 12 hours prior to your test. (ex: coffee, chocolate, sodas, tea). Do bring a list of your current medications with you. You may take your medications as normal. Do wear comfortable clothes (no dresses or overalls) and walking shoes, tennis shoes preferred (No heels or open toe shoes are allowed). Do NOT wear cologne, perfume, aftershave, or lotions (deodorant is allowed). If these instructions are not followed, your test will have to be rescheduled.   Please report to [address] for your test.  If you have questions or concerns about your appointment, you can call the Nuclear Lab at [phone].   If you cannot keep your appointment, please provide 24 hours notification to the Nuclear Lab, to avoid a possible $50 charge to your account.

## 2023-01-03 ENCOUNTER — Other Ambulatory Visit (HOSPITAL_COMMUNITY): Payer: Self-pay

## 2023-01-03 ENCOUNTER — Other Ambulatory Visit: Payer: Self-pay

## 2023-01-03 DIAGNOSIS — M25512 Pain in left shoulder: Secondary | ICD-10-CM | POA: Diagnosis not present

## 2023-01-03 DIAGNOSIS — M25511 Pain in right shoulder: Secondary | ICD-10-CM | POA: Diagnosis not present

## 2023-01-03 MED ORDER — MELOXICAM 15 MG PO TABS
15.0000 mg | ORAL_TABLET | Freq: Every day | ORAL | 0 refills | Status: DC | PRN
  Filled 2023-01-03: qty 30, 30d supply, fill #0

## 2023-01-08 ENCOUNTER — Other Ambulatory Visit: Payer: Self-pay | Admitting: Family Medicine

## 2023-01-08 DIAGNOSIS — Z1231 Encounter for screening mammogram for malignant neoplasm of breast: Secondary | ICD-10-CM

## 2023-01-13 ENCOUNTER — Other Ambulatory Visit (HOSPITAL_COMMUNITY): Payer: Self-pay

## 2023-01-14 ENCOUNTER — Other Ambulatory Visit (HOSPITAL_COMMUNITY): Payer: Self-pay

## 2023-01-14 MED ORDER — BUPROPION HCL ER (XL) 150 MG PO TB24
150.0000 mg | ORAL_TABLET | Freq: Every morning | ORAL | 3 refills | Status: DC
  Filled 2023-01-14: qty 90, 90d supply, fill #0
  Filled 2023-04-11: qty 90, 90d supply, fill #1
  Filled 2023-06-22: qty 90, 90d supply, fill #2

## 2023-01-23 ENCOUNTER — Other Ambulatory Visit (HOSPITAL_COMMUNITY): Payer: Self-pay

## 2023-01-24 ENCOUNTER — Other Ambulatory Visit (HOSPITAL_COMMUNITY): Payer: Self-pay

## 2023-01-24 MED ORDER — METFORMIN HCL ER 500 MG PO TB24
500.0000 mg | ORAL_TABLET | Freq: Two times a day (BID) | ORAL | 3 refills | Status: DC
  Filled 2023-01-24: qty 180, 90d supply, fill #0
  Filled 2023-04-28: qty 180, 90d supply, fill #1
  Filled 2023-07-22: qty 180, 90d supply, fill #2
  Filled 2023-10-23: qty 180, 90d supply, fill #3

## 2023-01-28 DIAGNOSIS — E119 Type 2 diabetes mellitus without complications: Secondary | ICD-10-CM | POA: Diagnosis not present

## 2023-01-29 ENCOUNTER — Encounter (HOSPITAL_COMMUNITY): Payer: Self-pay | Admitting: *Deleted

## 2023-01-30 ENCOUNTER — Other Ambulatory Visit (HOSPITAL_COMMUNITY): Payer: Self-pay

## 2023-01-30 ENCOUNTER — Other Ambulatory Visit: Payer: Self-pay

## 2023-01-30 MED ORDER — MELOXICAM 15 MG PO TABS
15.0000 mg | ORAL_TABLET | Freq: Every day | ORAL | 0 refills | Status: DC | PRN
  Filled 2023-01-30: qty 30, 30d supply, fill #0

## 2023-02-03 ENCOUNTER — Ambulatory Visit (HOSPITAL_BASED_OUTPATIENT_CLINIC_OR_DEPARTMENT_OTHER): Payer: PPO

## 2023-02-03 ENCOUNTER — Ambulatory Visit (HOSPITAL_COMMUNITY): Payer: PPO | Attending: Cardiovascular Disease

## 2023-02-03 DIAGNOSIS — E1165 Type 2 diabetes mellitus with hyperglycemia: Secondary | ICD-10-CM

## 2023-02-03 DIAGNOSIS — I700 Atherosclerosis of aorta: Secondary | ICD-10-CM | POA: Diagnosis not present

## 2023-02-03 DIAGNOSIS — I2510 Atherosclerotic heart disease of native coronary artery without angina pectoris: Secondary | ICD-10-CM | POA: Insufficient documentation

## 2023-02-03 DIAGNOSIS — I2584 Coronary atherosclerosis due to calcified coronary lesion: Secondary | ICD-10-CM | POA: Insufficient documentation

## 2023-02-03 LAB — ECHOCARDIOGRAM COMPLETE
Area-P 1/2: 3.7 cm2
S' Lateral: 3.2 cm

## 2023-02-03 MED ORDER — TECHNETIUM TC 99M TETROFOSMIN IV KIT
10.2000 | PACK | Freq: Once | INTRAVENOUS | Status: AC | PRN
  Administered 2023-02-03: 10.2 via INTRAVENOUS

## 2023-02-04 ENCOUNTER — Ambulatory Visit (HOSPITAL_COMMUNITY): Payer: PPO | Attending: Internal Medicine

## 2023-02-04 DIAGNOSIS — I2584 Coronary atherosclerosis due to calcified coronary lesion: Secondary | ICD-10-CM | POA: Insufficient documentation

## 2023-02-04 DIAGNOSIS — I700 Atherosclerosis of aorta: Secondary | ICD-10-CM | POA: Insufficient documentation

## 2023-02-04 DIAGNOSIS — I2510 Atherosclerotic heart disease of native coronary artery without angina pectoris: Secondary | ICD-10-CM | POA: Insufficient documentation

## 2023-02-04 DIAGNOSIS — E1165 Type 2 diabetes mellitus with hyperglycemia: Secondary | ICD-10-CM | POA: Insufficient documentation

## 2023-02-04 LAB — MYOCARDIAL PERFUSION IMAGING
LV dias vol: 73 mL (ref 46–106)
LV sys vol: 30 mL
Nuc Stress EF: 59 %
Peak HR: 96 {beats}/min
Rest HR: 65 {beats}/min
Rest Nuclear Isotope Dose: 10.2 mCi
SDS: 1
SRS: 0
SSS: 1
ST Depression (mm): 0 mm
Stress Nuclear Isotope Dose: 31.4 mCi
TID: 1.13

## 2023-02-04 MED ORDER — REGADENOSON 0.4 MG/5ML IV SOLN
0.4000 mg | Freq: Once | INTRAVENOUS | Status: AC
  Administered 2023-02-04: 0.4 mg via INTRAVENOUS

## 2023-02-04 MED ORDER — TECHNETIUM TC 99M TETROFOSMIN IV KIT
31.4000 | PACK | Freq: Once | INTRAVENOUS | Status: AC | PRN
  Administered 2023-02-04: 31.4 via INTRAVENOUS

## 2023-02-18 ENCOUNTER — Ambulatory Visit
Admission: RE | Admit: 2023-02-18 | Discharge: 2023-02-18 | Disposition: A | Discharge status: Home / Self Care | Payer: PPO | Source: Ambulatory Visit | Attending: Family Medicine | Admitting: Family Medicine

## 2023-02-18 DIAGNOSIS — Z1231 Encounter for screening mammogram for malignant neoplasm of breast: Secondary | ICD-10-CM

## 2023-02-28 ENCOUNTER — Other Ambulatory Visit (HOSPITAL_COMMUNITY): Payer: Self-pay

## 2023-02-28 ENCOUNTER — Other Ambulatory Visit: Payer: Self-pay

## 2023-02-28 MED ORDER — MELOXICAM 15 MG PO TABS
15.0000 mg | ORAL_TABLET | Freq: Every day | ORAL | 0 refills | Status: AC | PRN
  Filled 2023-02-28: qty 30, 30d supply, fill #0

## 2023-03-26 ENCOUNTER — Other Ambulatory Visit (HOSPITAL_COMMUNITY): Payer: Self-pay

## 2023-03-27 ENCOUNTER — Other Ambulatory Visit: Payer: Self-pay

## 2023-03-27 ENCOUNTER — Other Ambulatory Visit (HOSPITAL_COMMUNITY): Payer: Self-pay

## 2023-03-27 ENCOUNTER — Ambulatory Visit: Payer: PPO | Admitting: Podiatry

## 2023-03-27 ENCOUNTER — Encounter: Payer: Self-pay | Admitting: Podiatry

## 2023-03-27 DIAGNOSIS — M79674 Pain in right toe(s): Secondary | ICD-10-CM | POA: Diagnosis not present

## 2023-03-27 DIAGNOSIS — E119 Type 2 diabetes mellitus without complications: Secondary | ICD-10-CM

## 2023-03-27 DIAGNOSIS — M79675 Pain in left toe(s): Secondary | ICD-10-CM

## 2023-03-27 DIAGNOSIS — B351 Tinea unguium: Secondary | ICD-10-CM

## 2023-03-27 IMAGING — MG MM DIGITAL SCREENING BILAT W/ TOMO AND CAD
6 of 10 series · 6 of 30 positions shown · non-contrast
Comparison: Previous exam(s).

ACR Breast Density Category a: The breast tissue is almost entirely
fatty.

CLINICAL DATA: Screening.

EXAM:
DIGITAL SCREENING BILATERAL MAMMOGRAM WITH TOMOSYNTHESIS AND CAD
TECHNIQUE: Bilateral screening digital craniocaudal and mediolateral oblique
mammograms were obtained. Bilateral screening digital breast
tomosynthesis was performed. The images were evaluated with
computer-aided detection.

[L XCCL synth-2D]
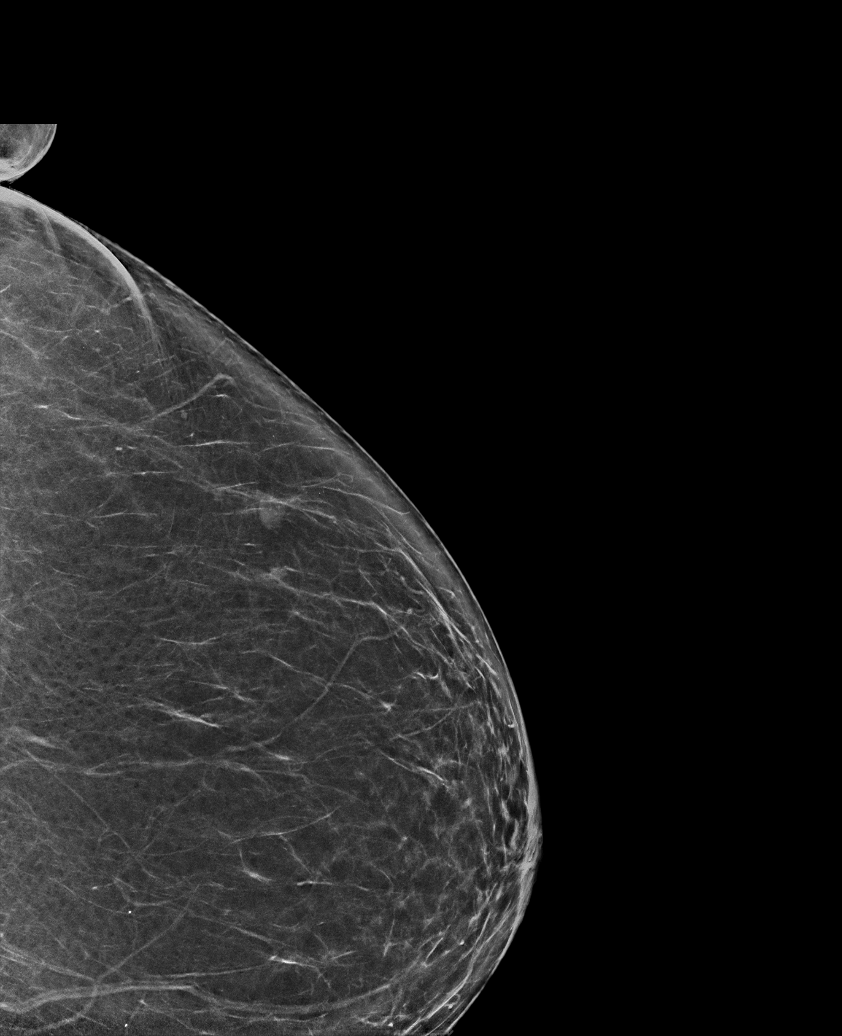

[L MLO synth-2D]
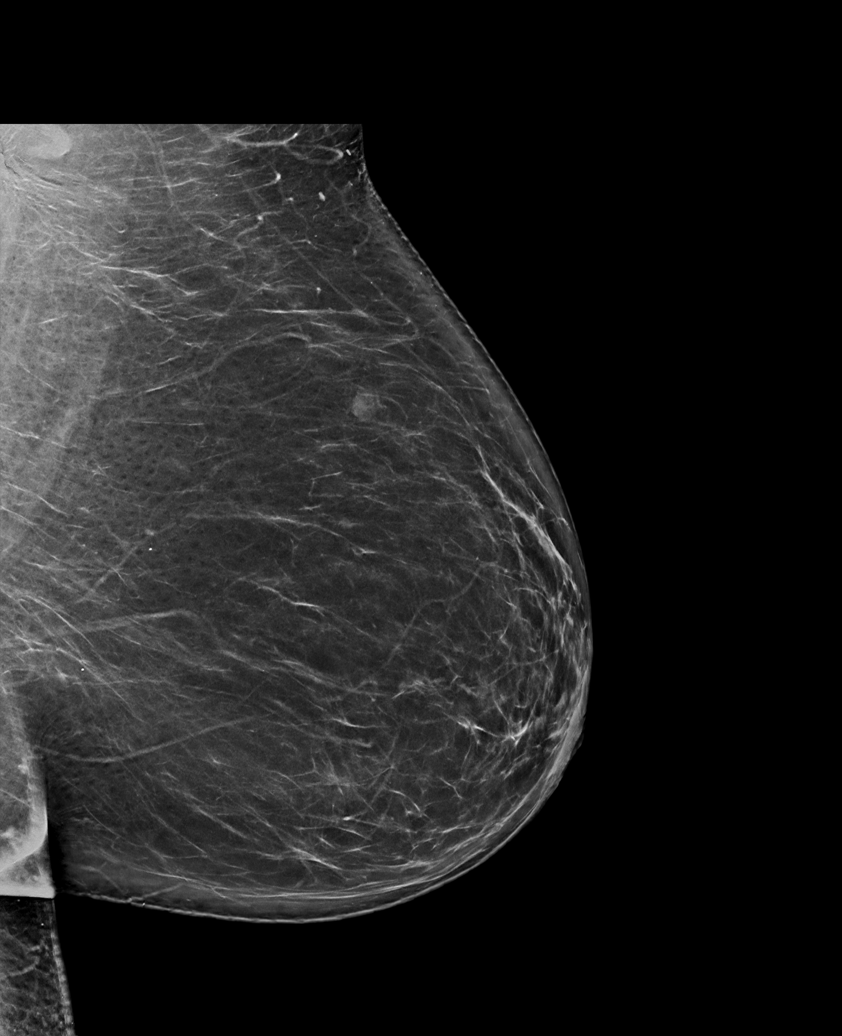

[L CC synth-2D]
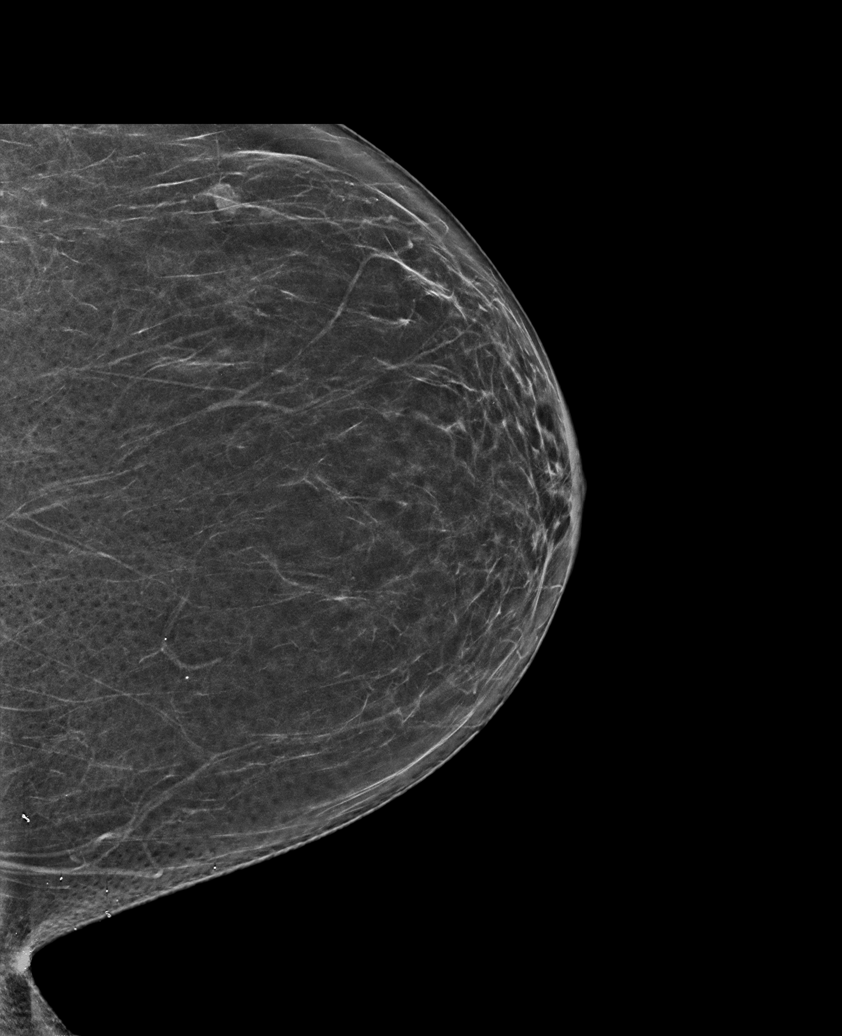

[R CC synth-2D]
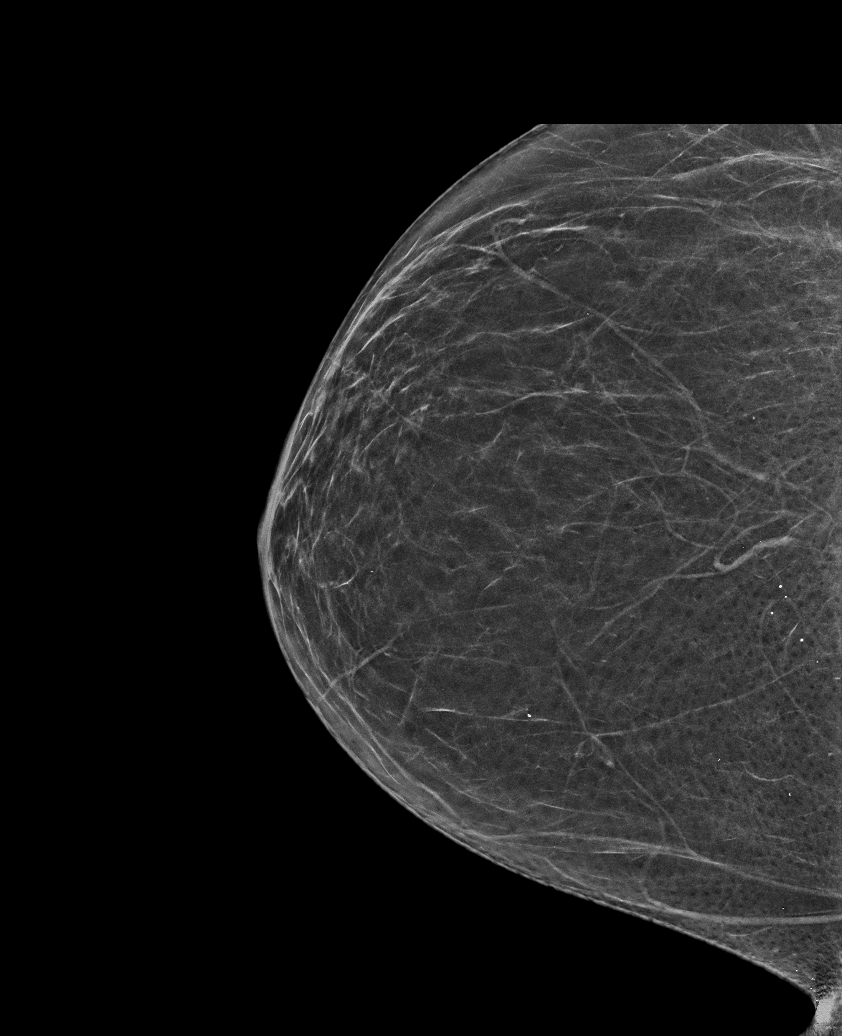

[R MLO synth-2D]
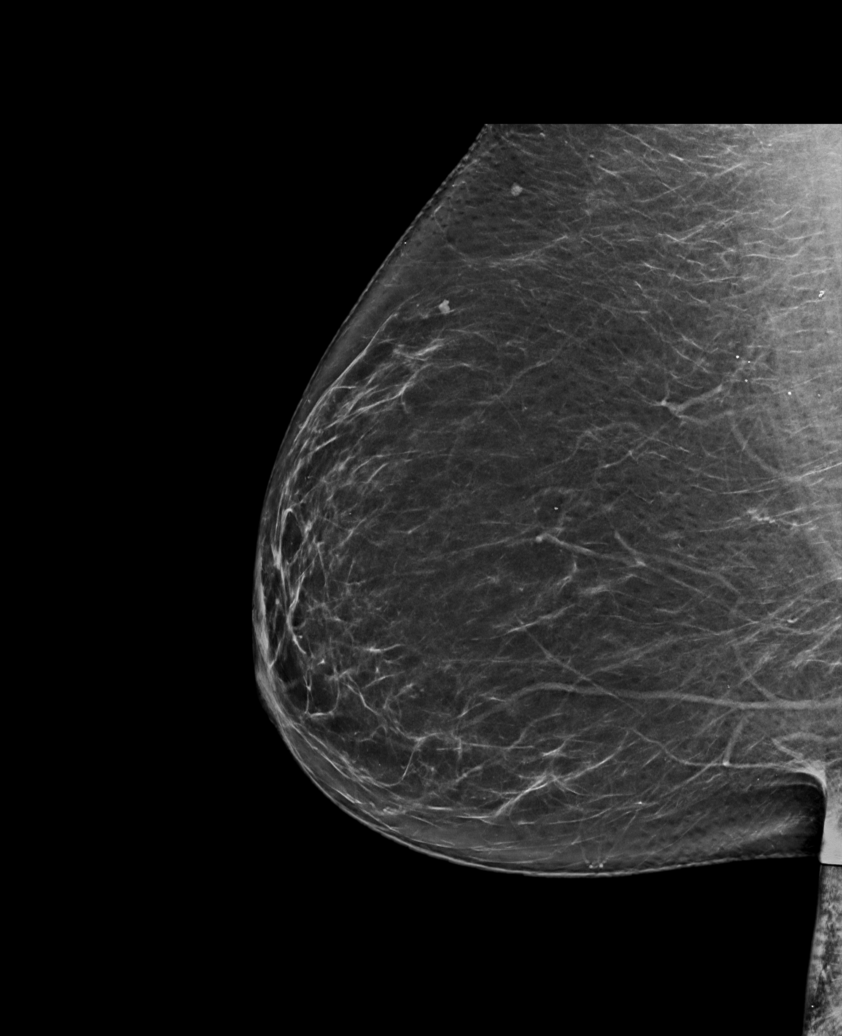

[R MLO tomo · tomo slice 39/78.0]
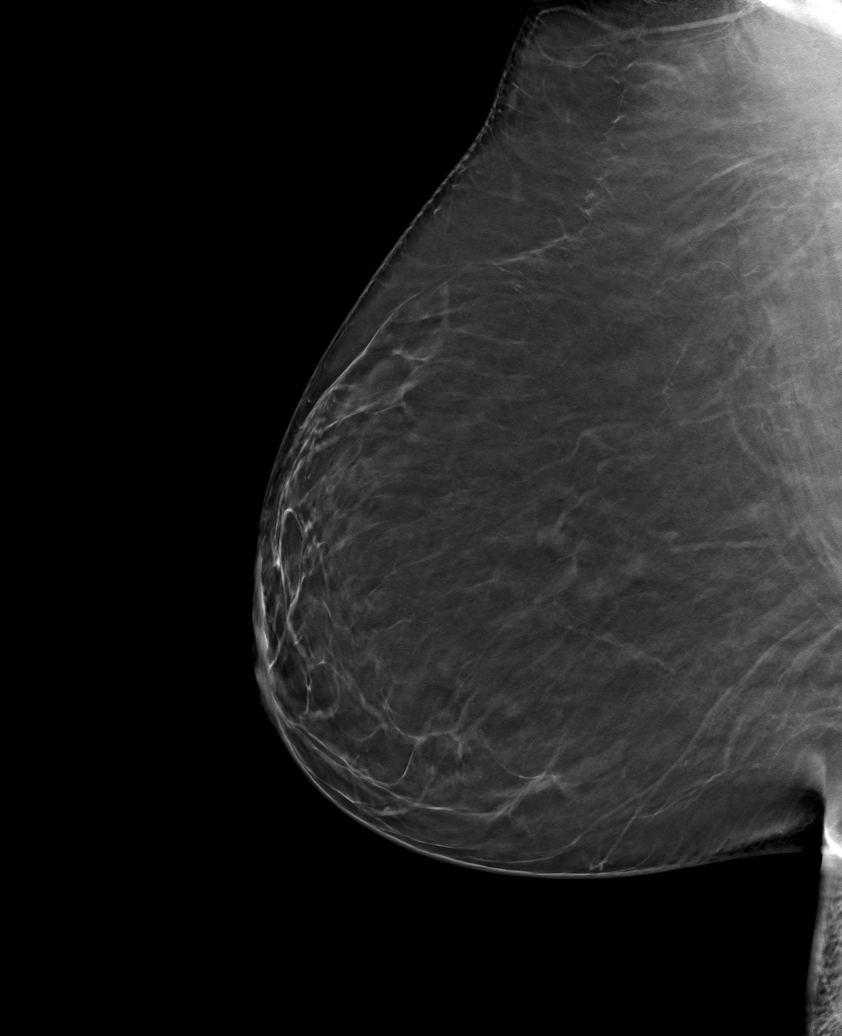

[6 of 30 positions shown; findings below may reference images not displayed]

FINDINGS: There are no findings suspicious for malignancy.
IMPRESSION: No mammographic evidence of malignancy. A result letter of this
screening mammogram will be mailed directly to the patient.

RECOMMENDATION:
Screening mammogram in one year. (Code:0E-3-N98)

BI-RADS CATEGORY  1: Negative.

## 2023-03-27 MED ORDER — ROSUVASTATIN CALCIUM 5 MG PO TABS
5.0000 mg | ORAL_TABLET | Freq: Every day | ORAL | 1 refills | Status: DC
  Filled 2023-03-27: qty 90, 90d supply, fill #0
  Filled 2023-06-24: qty 90, 90d supply, fill #1

## 2023-03-27 NOTE — Progress Notes (Signed)
This patient returns to my office for at risk foot care.  This patient requires this care by a professional since this patient will be at risk due to having diabetes.    This patient is unable to cut nails herself since the patient cannot reach her nails.These nails are painful walking and wearing shoes.  This patient presents for at risk foot care today.  General Appearance  Alert, conversant and in no acute stress.  Vascular  Dorsalis pedis and posterior tibial  pulses are palpable  bilaterally.  Capillary return is within normal limits  bilaterally. Temperature is within normal limits  bilaterally.  Neurologic  Senn-Weinstein monofilament wire test within normal limits  bilaterally. Muscle power within normal limits bilaterally.  Nails Thick disfigured discolored nails with subungual debris  from hallux to fifth toes bilaterally. No evidence of bacterial infection or drainage bilaterally.  Orthopedic  No limitations of motion  feet .  No crepitus or effusions noted.  No bony pathology or digital deformities noted.  Skin  normotropic skin with no porokeratosis noted bilaterally.  No signs of infections or ulcers noted.     Onychomycosis  Pain in right toes  Pain in left toes  Consent was obtained for treatment procedures.   Mechanical debridement of nails 1-5  bilaterally performed with a nail nipper.  Filed with dremel without incident.    Return office visit    3 months                  Told patient to return for periodic foot care and evaluation due to potential at risk complications.   Jayleigh Notarianni DPM   

## 2023-04-12 ENCOUNTER — Other Ambulatory Visit (HOSPITAL_COMMUNITY): Payer: Self-pay

## 2023-04-14 ENCOUNTER — Other Ambulatory Visit: Payer: Self-pay

## 2023-04-17 ENCOUNTER — Other Ambulatory Visit (HOSPITAL_COMMUNITY): Payer: Self-pay

## 2023-04-23 ENCOUNTER — Other Ambulatory Visit: Payer: Self-pay | Admitting: Orthopaedic Surgery

## 2023-04-23 DIAGNOSIS — G8929 Other chronic pain: Secondary | ICD-10-CM

## 2023-04-29 ENCOUNTER — Other Ambulatory Visit: Payer: Self-pay

## 2023-05-26 ENCOUNTER — Ambulatory Visit
Admission: RE | Admit: 2023-05-26 | Discharge: 2023-05-26 | Disposition: A | Discharge status: Home / Self Care | Payer: PPO | Source: Ambulatory Visit | Attending: Orthopaedic Surgery

## 2023-05-26 DIAGNOSIS — G8929 Other chronic pain: Secondary | ICD-10-CM

## 2023-05-26 DIAGNOSIS — M19012 Primary osteoarthritis, left shoulder: Secondary | ICD-10-CM | POA: Diagnosis not present

## 2023-05-26 DIAGNOSIS — M25512 Pain in left shoulder: Secondary | ICD-10-CM | POA: Diagnosis not present

## 2023-06-06 DIAGNOSIS — E782 Mixed hyperlipidemia: Secondary | ICD-10-CM | POA: Diagnosis not present

## 2023-06-06 DIAGNOSIS — F331 Major depressive disorder, recurrent, moderate: Secondary | ICD-10-CM | POA: Diagnosis not present

## 2023-06-06 DIAGNOSIS — F17210 Nicotine dependence, cigarettes, uncomplicated: Secondary | ICD-10-CM | POA: Diagnosis not present

## 2023-06-06 DIAGNOSIS — E119 Type 2 diabetes mellitus without complications: Secondary | ICD-10-CM | POA: Diagnosis not present

## 2023-06-06 DIAGNOSIS — M25511 Pain in right shoulder: Secondary | ICD-10-CM | POA: Diagnosis not present

## 2023-06-06 DIAGNOSIS — J439 Emphysema, unspecified: Secondary | ICD-10-CM | POA: Diagnosis not present

## 2023-06-19 ENCOUNTER — Other Ambulatory Visit (HOSPITAL_COMMUNITY): Payer: Self-pay

## 2023-06-23 ENCOUNTER — Other Ambulatory Visit (HOSPITAL_COMMUNITY): Payer: Self-pay

## 2023-06-23 ENCOUNTER — Other Ambulatory Visit: Payer: Self-pay

## 2023-06-24 ENCOUNTER — Other Ambulatory Visit (HOSPITAL_COMMUNITY): Payer: Self-pay

## 2023-06-24 ENCOUNTER — Other Ambulatory Visit (HOSPITAL_BASED_OUTPATIENT_CLINIC_OR_DEPARTMENT_OTHER): Payer: Self-pay

## 2023-06-25 ENCOUNTER — Other Ambulatory Visit (HOSPITAL_COMMUNITY): Payer: Self-pay

## 2023-06-25 MED ORDER — BUPROPION HCL ER (XL) 300 MG PO TB24
300.0000 mg | ORAL_TABLET | Freq: Every morning | ORAL | 3 refills | Status: AC
  Filled 2023-06-25: qty 90, 90d supply, fill #0
  Filled 2023-09-23: qty 90, 90d supply, fill #1
  Filled 2023-12-18: qty 90, 90d supply, fill #2
  Filled 2024-03-15: qty 90, 90d supply, fill #3

## 2023-06-26 ENCOUNTER — Other Ambulatory Visit (HOSPITAL_COMMUNITY): Payer: Self-pay

## 2023-06-26 ENCOUNTER — Ambulatory Visit: Payer: PPO | Admitting: Podiatry

## 2023-06-30 ENCOUNTER — Telehealth: Payer: Self-pay | Admitting: Acute Care

## 2023-06-30 NOTE — Telephone Encounter (Signed)
 Returned call from VM. No answer. Left message to call for scheduling LDCT

## 2023-07-01 ENCOUNTER — Ambulatory Visit: Payer: PPO | Attending: Cardiology | Admitting: Cardiology

## 2023-07-01 ENCOUNTER — Encounter: Payer: Self-pay | Admitting: Cardiology

## 2023-07-01 VITALS — BP 138/78 | HR 93 | Resp 16 | Ht 63.0 in | Wt 202.0 lb

## 2023-07-01 DIAGNOSIS — I2510 Atherosclerotic heart disease of native coronary artery without angina pectoris: Secondary | ICD-10-CM

## 2023-07-01 DIAGNOSIS — Z01810 Encounter for preprocedural cardiovascular examination: Secondary | ICD-10-CM | POA: Diagnosis not present

## 2023-07-01 DIAGNOSIS — E1165 Type 2 diabetes mellitus with hyperglycemia: Secondary | ICD-10-CM

## 2023-07-01 DIAGNOSIS — I2584 Coronary atherosclerosis due to calcified coronary lesion: Secondary | ICD-10-CM

## 2023-07-01 DIAGNOSIS — I700 Atherosclerosis of aorta: Secondary | ICD-10-CM

## 2023-07-01 DIAGNOSIS — F17210 Nicotine dependence, cigarettes, uncomplicated: Secondary | ICD-10-CM | POA: Diagnosis not present

## 2023-07-01 NOTE — Progress Notes (Signed)
 Cardiology Office Note:    Date:  07/01/2023  NAME:  Melissa Jimenez    MRN: [id number] DOB:  [birth year]   PCP:  Ransom Byers, MD  Former Cardiology Providers: None Primary Cardiologist:  Olinda Bertrand, DO, [facility] (established care 01/02/2023) Electrophysiologist:  None    Chief Complaint  Patient presents with   Coronary atherosclerosis due to calcified coronary lesion   Follow-up    History of Present Illness:    Melissa Jimenez is a 73 y.o. Caucasian female whose past medical history and cardiovascular risk factors includes: Coronary artery calcification (nongated CT scan in June 2024), aortic atherosclerosis, cigarette smoking, NIDDM.   Patient was referred to the practice back in October 2024 for evaluation of coronary artery disease as she had a CT of the chest for lung cancer screening.  She was recommended to undergo echocardiogram as well as nuclear stress test to rule out reversible ischemia.  She was started on aspirin  81 mg p.o. daily and she was already on Crestor  5 mg p.o. nightly and since lipids were well-controlled we will continue the same dose.  She presents today for follow-up.  Since last office visit patient denies any anginal chest pain or heart failure symptoms.  No hospitalizations or urgent care visits for cardiovascular reasons.  She has been compliant with her medical therapy.    Physical endurance remains stable.  Patient plans undergo left reverse shoulder replacement with Dr. Yvonne Hering on Jul 16, 2023.  Continues to smoke at least 9 cigarettes/day.   Current Medications: Current Meds  Medication Sig   aspirin  EC 81 MG tablet Take 1 tablet (81 mg total) by mouth daily. Swallow whole.   Blood Glucose Monitoring Suppl (ONETOUCH VERIO FLEX SYSTEM) w/Device KIT Use to check blood sugar 2 times a day   buPROPion  (WELLBUTRIN  XL) 300 MG 24 hr tablet Take 1 tablet (300 mg total) by mouth in the morning.   Calcium  Carb-Cholecalciferol (CALCIUM   + D3 PO) Take 1 tablet by mouth every evening.   carboxymethylcellulose (REFRESH TEARS) 0.5 % SOLN Place 1 drop into both eyes in the morning.   glucose blood test strip Use to check blood sugar daily   HYDROcodone -acetaminophen  (NORCO/VICODIN) 5-325 MG tablet Take 1 tablet by mouth every six to eight hours as needed for pain   ibuprofen (ADVIL) 200 MG tablet Take 600 mg by mouth 2 (two) times daily as needed (pain.).   Lancets (ONETOUCH DELICA PLUS LANCET33G) MISC Use daily as directed   meloxicam  (MOBIC ) 15 MG tablet Take 1 tablet (15 mg total) by mouth daily as needed for pain.   metFORMIN  (GLUCOPHAGE -XR) 500 MG 24 hr tablet Take 1 tablet (500 mg total) by mouth 2 (two) times daily with a meal.   rosuvastatin  (CRESTOR ) 5 MG tablet Take 1 tablet (5 mg total) by mouth at bedtime.   traMADol  (ULTRAM ) 50 MG tablet Take 1 - 2 tablets (50 - 100 mg total) by mouth every 6 (six) hours as needed for moderate pain.   [DISCONTINUED] meloxicam  (MOBIC ) 15 MG tablet Take 1 tablet (15 mg total) by mouth daily as needed for pain   [DISCONTINUED] rosuvastatin  (CRESTOR ) 5 MG tablet Take 1 tablet by mouth once a day at bedtime     Allergies:    Patient has no allergy information on record.   Past Medical History: Past Medical History:  Diagnosis Date   Anxiety    Arthritis    Diabetes mellitus without complication (HCC)  Past Surgical History: Past Surgical History:  Procedure Laterality Date   CATARACT EXTRACTION     THYROID  LOBECTOMY Left 03/28/2022   Procedure: LEFT THYROID  LOBECTOMY;  Surgeon: Oralee Billow, MD;  Location: [facility];  Service: General;  Laterality: Left;   WISDOM TOOTH EXTRACTION      Social History: Social History   Tobacco Use   Smoking status: Every Day    Current packs/day: 0.50    Average packs/day: 0.5 packs/day for 47.0 years (23.5 ttl pk-yrs)    Types: Cigarettes   Smokeless tobacco: Never  Vaping Use   Vaping status: Never Used  Substance Use Topics   Alcohol   use: No    Alcohol /week: 0.0 standard drinks of alcohol    Drug use: No    Family History: Family History  Problem Relation Age of Onset   Cancer Mother    Cancer Father    Breast cancer Sister 52 - 25   Ovarian cancer Sister 29 - 46   Hyperlipidemia Daughter    BRCA 1/2 Neg Hx     ROS:   Review of Systems  Cardiovascular:  Negative for chest pain, claudication, dyspnea on exertion, irregular heartbeat, leg swelling, near-syncope, orthopnea, palpitations, paroxysmal nocturnal dyspnea and syncope.  Respiratory:  Negative for shortness of breath.   Hematologic/Lymphatic: Negative for bleeding problem.  Musculoskeletal:  Positive for back pain. Negative for muscle cramps and myalgias.  Neurological:  Negative for dizziness and light-headedness.       Radicular pain    EKGs/Labs/Other Studies Reviewed:   EKG Interpretation Date/Time:  Tuesday July 01 2023 09:19:48 EDT Ventricular Rate:  92 PR Interval:  184 QRS Duration:  78 QT Interval:  374 QTC Calculation: 462 R Axis:   21  Text Interpretation: Normal sinus rhythm with sinus arrhythmia Low voltage QRS Septal infarct (cited on or before 12-Mar-2022) When compared with ECG of 02-Jan-2023 13:13, No significant change since last tracing Confirmed by Olinda Bertrand [phone]) on 07/01/2023 9:42:39 AM    Echo  February 03 2023 LVEF: 60 to 65% Diastolic Function: Normal Average GLS: -19.6% Mild LAE. No significant valvular heart disease Estimated RAP 3 mmHg  MPI  02/03/2023: LV perfusion normal. Low risk study See report for additional details   RADIOLOGY: CT scan lung cancer screening: June 2024: 1. Lung-RADS 2S, benign appearance or behavior. Continue annual screening with low-dose chest CT without contrast in 12 months.  2. The "S" modifier above refers to potentially clinically significant non lung cancer related findings. Specifically, there is aortic atherosclerosis, in addition to left main and 2 vessel coronary  artery disease. Please note that although the presence of coronary artery calcium  documents the presence of coronary artery disease, the severity of this disease and any potential stenosis cannot be assessed on this non-gated CT examination. Assessment for potential risk factor modification, dietary therapy or pharmacologic therapy may be warranted, if clinically indicated. 3. Mild diffuse bronchial wall thickening with mild centrilobular and paraseptal emphysema; imaging findings suggestive of underlying COPD. Aortic Atherosclerosis (ICD10-I70.0) and Emphysema (ICD10-J43.9).   Labs:    Latest Ref Rng & Units 03/12/2022    9:50 AM  CBC  WBC 4.0 - 10.5 K/uL 12.2   Hemoglobin 12.0 - 15.0 g/dL 60.4   Hematocrit 54.0 - 46.0 % 44.6   Platelets 150 - 400 K/uL 298        Latest Ref Rng & Units 03/12/2022    9:50 AM  BMP  Glucose 70 - 99 mg/dL 981   BUN  8 - 23 mg/dL 18   Creatinine 1.61 - 1.00 mg/dL 0.96   Sodium 045 - 409 mmol/L 137   Potassium 3.5 - 5.1 mmol/L 4.1   Chloride 98 - 111 mmol/L 103   CO2 22 - 32 mmol/L 25   Calcium  8.9 - 10.3 mg/dL 9.0       Latest Ref Rng & Units 03/12/2022    9:50 AM  CMP  Glucose 70 - 99 mg/dL 811   BUN 8 - 23 mg/dL 18   Creatinine 9.14 - 1.00 mg/dL 7.82   Sodium 956 - 213 mmol/L 137   Potassium 3.5 - 5.1 mmol/L 4.1   Chloride 98 - 111 mmol/L 103   CO2 22 - 32 mmol/L 25   Calcium  8.9 - 10.3 mg/dL 9.0     No results found for: "CHOL", "HDL", "LDLCALC", "LDLDIRECT", "TRIG", "CHOLHDL" No results for input(s): "LIPOA" in the last 8760 hours. No components found for: "NTPROBNP" No results for input(s): "PROBNP" in the last 8760 hours. No results for input(s): "TSH" in the last 8760 hours.  External Labs: Collected: December 03, 2022 provided by referring physician: Total cholesterol 149, triglycerides 271, HDL 48, calculated LDL 58, non-HDL 101. BUN 8, creatinine 0.71 Hemoglobin and hematocrit within acceptable limits.  Physical Exam:     Today's Vitals   07/01/23 0919  BP: 138/78  Pulse: 93  Resp: 16  SpO2: 94%  Weight: 202 lb (91.6 kg)  Height: 5\' 3"  (1.6 m)   Body mass index is 35.78 kg/m. Wt Readings from Last 3 Encounters:  07/01/23 202 lb (91.6 kg)  02/03/23 186 lb (84.4 kg)  01/02/23 186 lb 3.2 oz (84.5 kg)    Physical Exam  Constitutional: No distress.  hemodynamically stable  Neck: No JVD present.  Cardiovascular: Normal rate, regular rhythm, S1 normal, S2 normal, intact distal pulses and normal pulses. Exam reveals no gallop, no S3 and no S4.  No murmur heard. Pulmonary/Chest: Effort normal and breath sounds normal. No stridor. She has no wheezes. She has no rales.  Abdominal: Soft. Bowel sounds are normal. She exhibits no distension. There is no abdominal tenderness.  Musculoskeletal:        General: No edema.     Cervical back: Neck supple.  Neurological: She is alert and oriented to person, place, and time. She has intact cranial nerves (2-12).  Skin: Skin is warm and moist.     Impression & Recommendation(s):  Impression:   ICD-10-CM   1. Coronary atherosclerosis due to calcified coronary lesion  I25.10    I25.84     2. Aortic atherosclerosis (HCC)  I70.0 EKG 12-Lead    3. Preop cardiovascular exam  Z01.810     4. Type 2 diabetes mellitus with hyperglycemia, without long-term current use of insulin  (HCC)  E11.65     5. Cigarette smoker  F17.210        Recommendation(s):  Coronary atherosclerosis due to calcified coronary lesion Aortic atherosclerosis (HCC) No chest pain or heart failure symptoms. Echocardiogram notes preserved LVEF, normal diastolic function, no significant valvular heart disease. Nuclear stress test from November 2024 reported to be low risk. Reemphasized the importance of secondary prevention with focus on improving her modifiable cardiovascular risk factors such as glycemic control, lipid management, blood pressure control, weight loss, complete smoking  cessation  Preop cardiovascular exam Tentatively scheduled for left reverse shoulder replacement with Dr. Yvonne Hering Jul 16 2023 According to the Revised Cardiac Risk Index (RCRI), her Perioperative Risk of Major Cardiac  Event is (%): 0.4 Her Functional Capacity in METs is: 5.62 according to the Duke Activity Status Index (DASI). Recent echo and stress test results reviewed. Overall acceptable risk for upcoming noncardiac surgery.  Type 2 diabetes mellitus with hyperglycemia, without long-term current use of insulin  (HCC) Most recent hemoglobin A1c 6.6 Reemphasized importance of glycemic control  Cigarette smoker Tobacco cessation counseling: Improving Currently smoking 9 cigarettes/day, was less than 0.5 packs/day She is informed of the dangers of tobacco abuse including stroke, cancer, and MI, as well as benefits of tobacco cessation. She is willing to quit at this time. 5 mins were spent counseling patient cessation techniques. We discussed various methods to help quit smoking, including deciding on a date to quit, joining a support group, pharmacological agents- nicotine gum/patch/lozenges.  I will reassess her progress at the next follow-up visit   Orders Placed:  Orders Placed This Encounter  Procedures   EKG 12-Lead   Final Medication List:    No orders of the defined types were placed in this encounter.    Current Outpatient Medications:    aspirin  EC 81 MG tablet, Take 1 tablet (81 mg total) by mouth daily. Swallow whole., Disp: , Rfl:    Blood Glucose Monitoring Suppl (ONETOUCH VERIO FLEX SYSTEM) w/Device KIT, Use to check blood sugar 2 times a day, Disp: 1 kit, Rfl: 0   buPROPion  (WELLBUTRIN  XL) 300 MG 24 hr tablet, Take 1 tablet (300 mg total) by mouth in the morning., Disp: 90 tablet, Rfl: 3   Calcium  Carb-Cholecalciferol (CALCIUM  + D3 PO), Take 1 tablet by mouth every evening., Disp: , Rfl:    carboxymethylcellulose (REFRESH TEARS) 0.5 % SOLN, Place 1 drop into both  eyes in the morning., Disp: , Rfl:    glucose blood test strip, Use to check blood sugar daily, Disp: 100 each, Rfl: 0   HYDROcodone -acetaminophen  (NORCO/VICODIN) 5-325 MG tablet, Take 1 tablet by mouth every six to eight hours as needed for pain, Disp: 20 tablet, Rfl: 0   ibuprofen (ADVIL) 200 MG tablet, Take 600 mg by mouth 2 (two) times daily as needed (pain.)., Disp: , Rfl:    Lancets (ONETOUCH DELICA PLUS LANCET33G) MISC, Use daily as directed, Disp: 100 each, Rfl: 0   meloxicam  (MOBIC ) 15 MG tablet, Take 1 tablet (15 mg total) by mouth daily as needed for pain., Disp: 30 tablet, Rfl: 0   metFORMIN  (GLUCOPHAGE -XR) 500 MG 24 hr tablet, Take 1 tablet (500 mg total) by mouth 2 (two) times daily with a meal., Disp: 180 tablet, Rfl: 3   rosuvastatin  (CRESTOR ) 5 MG tablet, Take 1 tablet (5 mg total) by mouth at bedtime., Disp: 90 tablet, Rfl: 1   traMADol  (ULTRAM ) 50 MG tablet, Take 1 - 2 tablets (50 - 100 mg total) by mouth every 6 (six) hours as needed for moderate pain., Disp: 15 tablet, Rfl: 0  Consent:   NA   Disposition:   1 year follow-up sooner  Her questions and concerns were addressed to her satisfaction. She voices understanding of the recommendations provided during this encounter.    Signed, Olinda Bertrand, DO, [facility]  [facility]  [address] [place], [place] [address] 07/01/2023 10:57 AM

## 2023-07-01 NOTE — Patient Instructions (Signed)
 Medication Instructions:  Your physician recommends that you continue on your current medications as directed. Please refer to the Current Medication list given to you today.  *If you need a refill on your cardiac medications before your next appointment, please call your pharmacy*  Lab Work: None ordered today. If you have labs (blood work) drawn today and your tests are completely normal, you will receive your results only by: MyChart Message (if you have MyChart) OR A paper copy in the mail If you have any lab test that is abnormal or we need to change your treatment, we will call you to review the results.  Testing/Procedures: None ordered today.  Follow-Up: At [facility], you and your health needs are our priority.  As part of our continuing mission to provide you with exceptional heart care, we have created designated Provider Care Teams.  These Care Teams include your primary Cardiologist (physician) and Advanced Practice Providers (APPs -  Physician Assistants and Nurse Practitioners) who all work together to provide you with the care you need, when you need it.  We recommend signing up for the patient portal called "MyChart".  Sign up information is provided on this After Visit Summary.  MyChart is used to connect with patients for Virtual Visits (Telemedicine).  Patients are able to view lab/test results, encounter notes, upcoming appointments, etc.  Non-urgent messages can be sent to your provider as well.   To learn more about what you can do with MyChart, go to [email or web address].    Your next appointment:   1 year(s)  The format for your next appointment:   In Person  Provider:   Tessa Lerner, DO {  Other Instructions   1st Floor: - Lobby - Registration  - Pharmacy  - Lab - Cafe  2nd Floor: - PV Lab - Diagnostic Testing (echo, CT, nuclear med)  3rd Floor: - Vacant  4th Floor: - TCTS (cardiothoracic surgery) - AFib Clinic - Structural Heart  Clinic - Vascular Surgery  - Vascular Ultrasound  5th Floor: - HeartCare Cardiology (general and EP) - Clinical Pharmacy for coumadin, hypertension, lipid, weight-loss medications, and med management appointments    Valet parking services will be available as well.

## 2023-07-16 ENCOUNTER — Other Ambulatory Visit: Payer: Self-pay

## 2023-07-16 ENCOUNTER — Other Ambulatory Visit (HOSPITAL_COMMUNITY): Payer: Self-pay

## 2023-07-16 DIAGNOSIS — G8918 Other acute postprocedural pain: Secondary | ICD-10-CM | POA: Diagnosis not present

## 2023-07-16 DIAGNOSIS — M65912 Unspecified synovitis and tenosynovitis, left shoulder: Secondary | ICD-10-CM | POA: Diagnosis not present

## 2023-07-16 DIAGNOSIS — M19072 Primary osteoarthritis, left ankle and foot: Secondary | ICD-10-CM | POA: Diagnosis not present

## 2023-07-16 DIAGNOSIS — M25712 Osteophyte, left shoulder: Secondary | ICD-10-CM | POA: Diagnosis not present

## 2023-07-16 DIAGNOSIS — M19012 Primary osteoarthritis, left shoulder: Secondary | ICD-10-CM | POA: Diagnosis not present

## 2023-07-16 MED ORDER — ASPIRIN 81 MG PO CHEW
81.0000 mg | CHEWABLE_TABLET | Freq: Two times a day (BID) | ORAL | 0 refills | Status: AC
  Filled 2023-07-16: qty 84, 42d supply, fill #0

## 2023-07-16 MED ORDER — CELECOXIB 100 MG PO CAPS
100.0000 mg | ORAL_CAPSULE | Freq: Two times a day (BID) | ORAL | 2 refills | Status: AC
  Filled 2023-07-16: qty 60, 30d supply, fill #0
  Filled 2023-09-09: qty 60, 30d supply, fill #1

## 2023-07-16 MED ORDER — ONDANSETRON 4 MG PO TBDP
4.0000 mg | ORAL_TABLET | Freq: Three times a day (TID) | ORAL | 0 refills | Status: AC | PRN
  Filled 2023-07-16 (×2): qty 10, 4d supply, fill #0

## 2023-07-16 MED ORDER — ACETAMINOPHEN 500 MG PO TABS
1000.0000 mg | ORAL_TABLET | Freq: Three times a day (TID) | ORAL | 0 refills | Status: AC
  Filled 2023-07-16: qty 84, 14d supply, fill #0

## 2023-07-16 MED ORDER — OXYCODONE HCL 5 MG PO TABS
5.0000 mg | ORAL_TABLET | Freq: Four times a day (QID) | ORAL | 0 refills | Status: AC | PRN
Start: 2023-07-16 — End: ?
  Filled 2023-07-16: qty 30, 5d supply, fill #0

## 2023-07-18 DIAGNOSIS — M19012 Primary osteoarthritis, left shoulder: Secondary | ICD-10-CM | POA: Diagnosis not present

## 2023-07-18 DIAGNOSIS — M25612 Stiffness of left shoulder, not elsewhere classified: Secondary | ICD-10-CM | POA: Diagnosis not present

## 2023-07-18 DIAGNOSIS — M6281 Muscle weakness (generalized): Secondary | ICD-10-CM | POA: Diagnosis not present

## 2023-07-21 DIAGNOSIS — M6281 Muscle weakness (generalized): Secondary | ICD-10-CM | POA: Diagnosis not present

## 2023-07-21 DIAGNOSIS — M25612 Stiffness of left shoulder, not elsewhere classified: Secondary | ICD-10-CM | POA: Diagnosis not present

## 2023-07-21 DIAGNOSIS — M19012 Primary osteoarthritis, left shoulder: Secondary | ICD-10-CM | POA: Diagnosis not present

## 2023-07-22 ENCOUNTER — Other Ambulatory Visit (HOSPITAL_COMMUNITY): Payer: Self-pay

## 2023-07-23 ENCOUNTER — Other Ambulatory Visit (HOSPITAL_COMMUNITY): Payer: Self-pay

## 2023-07-23 MED ORDER — ONDANSETRON 4 MG PO TBDP
ORAL_TABLET | ORAL | 0 refills | Status: AC
Start: 2023-07-22 — End: ?
  Filled 2023-07-23: qty 10, 3d supply, fill #0

## 2023-07-24 ENCOUNTER — Other Ambulatory Visit (HOSPITAL_COMMUNITY): Payer: Self-pay

## 2023-07-25 DIAGNOSIS — M6281 Muscle weakness (generalized): Secondary | ICD-10-CM | POA: Diagnosis not present

## 2023-07-25 DIAGNOSIS — M25612 Stiffness of left shoulder, not elsewhere classified: Secondary | ICD-10-CM | POA: Diagnosis not present

## 2023-07-25 DIAGNOSIS — M19012 Primary osteoarthritis, left shoulder: Secondary | ICD-10-CM | POA: Diagnosis not present

## 2023-07-28 DIAGNOSIS — M6281 Muscle weakness (generalized): Secondary | ICD-10-CM | POA: Diagnosis not present

## 2023-07-28 DIAGNOSIS — M25612 Stiffness of left shoulder, not elsewhere classified: Secondary | ICD-10-CM | POA: Diagnosis not present

## 2023-07-28 DIAGNOSIS — M19012 Primary osteoarthritis, left shoulder: Secondary | ICD-10-CM | POA: Diagnosis not present

## 2023-07-30 DIAGNOSIS — M25612 Stiffness of left shoulder, not elsewhere classified: Secondary | ICD-10-CM | POA: Diagnosis not present

## 2023-07-30 DIAGNOSIS — M19012 Primary osteoarthritis, left shoulder: Secondary | ICD-10-CM | POA: Diagnosis not present

## 2023-07-30 DIAGNOSIS — M6281 Muscle weakness (generalized): Secondary | ICD-10-CM | POA: Diagnosis not present

## 2023-07-31 ENCOUNTER — Encounter: Payer: Self-pay | Admitting: Pharmacist

## 2023-07-31 ENCOUNTER — Other Ambulatory Visit: Payer: Self-pay

## 2023-07-31 ENCOUNTER — Other Ambulatory Visit (HOSPITAL_COMMUNITY): Payer: Self-pay

## 2023-07-31 DIAGNOSIS — M19012 Primary osteoarthritis, left shoulder: Secondary | ICD-10-CM | POA: Diagnosis not present

## 2023-07-31 MED ORDER — METHYLPREDNISOLONE 4 MG PO TBPK
ORAL_TABLET | ORAL | 0 refills | Status: AC
Start: 2023-07-31 — End: ?
  Filled 2023-07-31 – 2023-08-01 (×2): qty 21, 6d supply, fill #0

## 2023-08-01 ENCOUNTER — Other Ambulatory Visit (HOSPITAL_COMMUNITY): Payer: Self-pay

## 2023-08-01 ENCOUNTER — Other Ambulatory Visit: Payer: Self-pay

## 2023-08-08 DIAGNOSIS — M25612 Stiffness of left shoulder, not elsewhere classified: Secondary | ICD-10-CM | POA: Diagnosis not present

## 2023-08-08 DIAGNOSIS — M19012 Primary osteoarthritis, left shoulder: Secondary | ICD-10-CM | POA: Diagnosis not present

## 2023-08-08 DIAGNOSIS — M6281 Muscle weakness (generalized): Secondary | ICD-10-CM | POA: Diagnosis not present

## 2023-08-13 DIAGNOSIS — M6281 Muscle weakness (generalized): Secondary | ICD-10-CM | POA: Diagnosis not present

## 2023-08-13 DIAGNOSIS — M25612 Stiffness of left shoulder, not elsewhere classified: Secondary | ICD-10-CM | POA: Diagnosis not present

## 2023-08-13 DIAGNOSIS — M19012 Primary osteoarthritis, left shoulder: Secondary | ICD-10-CM | POA: Diagnosis not present

## 2023-08-15 DIAGNOSIS — M25612 Stiffness of left shoulder, not elsewhere classified: Secondary | ICD-10-CM | POA: Diagnosis not present

## 2023-08-15 DIAGNOSIS — M6281 Muscle weakness (generalized): Secondary | ICD-10-CM | POA: Diagnosis not present

## 2023-08-15 DIAGNOSIS — M19012 Primary osteoarthritis, left shoulder: Secondary | ICD-10-CM | POA: Diagnosis not present

## 2023-08-19 DIAGNOSIS — M19012 Primary osteoarthritis, left shoulder: Secondary | ICD-10-CM | POA: Diagnosis not present

## 2023-08-20 DIAGNOSIS — M25612 Stiffness of left shoulder, not elsewhere classified: Secondary | ICD-10-CM | POA: Diagnosis not present

## 2023-08-20 DIAGNOSIS — M6281 Muscle weakness (generalized): Secondary | ICD-10-CM | POA: Diagnosis not present

## 2023-08-20 DIAGNOSIS — M19012 Primary osteoarthritis, left shoulder: Secondary | ICD-10-CM | POA: Diagnosis not present

## 2023-08-22 DIAGNOSIS — M25612 Stiffness of left shoulder, not elsewhere classified: Secondary | ICD-10-CM | POA: Diagnosis not present

## 2023-08-22 DIAGNOSIS — M6281 Muscle weakness (generalized): Secondary | ICD-10-CM | POA: Diagnosis not present

## 2023-08-22 DIAGNOSIS — M19012 Primary osteoarthritis, left shoulder: Secondary | ICD-10-CM | POA: Diagnosis not present

## 2023-08-25 DIAGNOSIS — E119 Type 2 diabetes mellitus without complications: Secondary | ICD-10-CM | POA: Diagnosis not present

## 2023-08-25 DIAGNOSIS — I2510 Atherosclerotic heart disease of native coronary artery without angina pectoris: Secondary | ICD-10-CM | POA: Diagnosis not present

## 2023-08-25 DIAGNOSIS — J439 Emphysema, unspecified: Secondary | ICD-10-CM | POA: Diagnosis not present

## 2023-08-27 ENCOUNTER — Telehealth: Payer: Self-pay | Admitting: Acute Care

## 2023-08-27 DIAGNOSIS — M6281 Muscle weakness (generalized): Secondary | ICD-10-CM | POA: Diagnosis not present

## 2023-08-27 DIAGNOSIS — M19012 Primary osteoarthritis, left shoulder: Secondary | ICD-10-CM | POA: Diagnosis not present

## 2023-08-27 DIAGNOSIS — M25612 Stiffness of left shoulder, not elsewhere classified: Secondary | ICD-10-CM | POA: Diagnosis not present

## 2023-08-27 NOTE — Telephone Encounter (Signed)
 Left Vm for patient to call [phone] to schedule annual LDCT

## 2023-08-29 ENCOUNTER — Other Ambulatory Visit: Payer: Self-pay | Admitting: Acute Care

## 2023-08-29 DIAGNOSIS — F17210 Nicotine dependence, cigarettes, uncomplicated: Secondary | ICD-10-CM

## 2023-08-29 DIAGNOSIS — Z122 Encounter for screening for malignant neoplasm of respiratory organs: Secondary | ICD-10-CM

## 2023-08-29 DIAGNOSIS — Z87891 Personal history of nicotine dependence: Secondary | ICD-10-CM

## 2023-09-03 DIAGNOSIS — M6281 Muscle weakness (generalized): Secondary | ICD-10-CM | POA: Diagnosis not present

## 2023-09-03 DIAGNOSIS — M25612 Stiffness of left shoulder, not elsewhere classified: Secondary | ICD-10-CM | POA: Diagnosis not present

## 2023-09-03 DIAGNOSIS — M19012 Primary osteoarthritis, left shoulder: Secondary | ICD-10-CM | POA: Diagnosis not present

## 2023-09-05 DIAGNOSIS — M6281 Muscle weakness (generalized): Secondary | ICD-10-CM | POA: Diagnosis not present

## 2023-09-05 DIAGNOSIS — M19012 Primary osteoarthritis, left shoulder: Secondary | ICD-10-CM | POA: Diagnosis not present

## 2023-09-05 DIAGNOSIS — M25612 Stiffness of left shoulder, not elsewhere classified: Secondary | ICD-10-CM | POA: Diagnosis not present

## 2023-09-08 DIAGNOSIS — F4321 Adjustment disorder with depressed mood: Secondary | ICD-10-CM | POA: Diagnosis not present

## 2023-09-08 DIAGNOSIS — F331 Major depressive disorder, recurrent, moderate: Secondary | ICD-10-CM | POA: Diagnosis not present

## 2023-09-08 DIAGNOSIS — J439 Emphysema, unspecified: Secondary | ICD-10-CM | POA: Diagnosis not present

## 2023-09-08 DIAGNOSIS — E119 Type 2 diabetes mellitus without complications: Secondary | ICD-10-CM | POA: Diagnosis not present

## 2023-09-08 DIAGNOSIS — E782 Mixed hyperlipidemia: Secondary | ICD-10-CM | POA: Diagnosis not present

## 2023-09-08 DIAGNOSIS — I2510 Atherosclerotic heart disease of native coronary artery without angina pectoris: Secondary | ICD-10-CM | POA: Diagnosis not present

## 2023-09-09 ENCOUNTER — Other Ambulatory Visit: Payer: Self-pay

## 2023-09-09 DIAGNOSIS — M25612 Stiffness of left shoulder, not elsewhere classified: Secondary | ICD-10-CM | POA: Diagnosis not present

## 2023-09-09 DIAGNOSIS — M6281 Muscle weakness (generalized): Secondary | ICD-10-CM | POA: Diagnosis not present

## 2023-09-09 DIAGNOSIS — M19012 Primary osteoarthritis, left shoulder: Secondary | ICD-10-CM | POA: Diagnosis not present

## 2023-09-11 DIAGNOSIS — M19012 Primary osteoarthritis, left shoulder: Secondary | ICD-10-CM | POA: Diagnosis not present

## 2023-09-11 DIAGNOSIS — M25612 Stiffness of left shoulder, not elsewhere classified: Secondary | ICD-10-CM | POA: Diagnosis not present

## 2023-09-11 DIAGNOSIS — M6281 Muscle weakness (generalized): Secondary | ICD-10-CM | POA: Diagnosis not present

## 2023-09-16 ENCOUNTER — Ambulatory Visit
Admission: RE | Admit: 2023-09-16 | Discharge: 2023-09-16 | Disposition: A | Discharge status: Home / Self Care | Source: Ambulatory Visit | Attending: Acute Care | Admitting: Acute Care

## 2023-09-16 DIAGNOSIS — Z87891 Personal history of nicotine dependence: Secondary | ICD-10-CM

## 2023-09-16 DIAGNOSIS — F17210 Nicotine dependence, cigarettes, uncomplicated: Secondary | ICD-10-CM

## 2023-09-16 DIAGNOSIS — Z122 Encounter for screening for malignant neoplasm of respiratory organs: Secondary | ICD-10-CM

## 2023-09-17 DIAGNOSIS — M19012 Primary osteoarthritis, left shoulder: Secondary | ICD-10-CM | POA: Diagnosis not present

## 2023-09-17 DIAGNOSIS — M6281 Muscle weakness (generalized): Secondary | ICD-10-CM | POA: Diagnosis not present

## 2023-09-17 DIAGNOSIS — M25612 Stiffness of left shoulder, not elsewhere classified: Secondary | ICD-10-CM | POA: Diagnosis not present

## 2023-09-23 ENCOUNTER — Other Ambulatory Visit (HOSPITAL_COMMUNITY): Payer: Self-pay

## 2023-09-23 DIAGNOSIS — M25612 Stiffness of left shoulder, not elsewhere classified: Secondary | ICD-10-CM | POA: Diagnosis not present

## 2023-09-23 DIAGNOSIS — E119 Type 2 diabetes mellitus without complications: Secondary | ICD-10-CM | POA: Diagnosis not present

## 2023-09-23 DIAGNOSIS — J439 Emphysema, unspecified: Secondary | ICD-10-CM | POA: Diagnosis not present

## 2023-09-23 DIAGNOSIS — I2510 Atherosclerotic heart disease of native coronary artery without angina pectoris: Secondary | ICD-10-CM | POA: Diagnosis not present

## 2023-09-23 DIAGNOSIS — M19012 Primary osteoarthritis, left shoulder: Secondary | ICD-10-CM | POA: Diagnosis not present

## 2023-09-23 DIAGNOSIS — M6281 Muscle weakness (generalized): Secondary | ICD-10-CM | POA: Diagnosis not present

## 2023-09-24 ENCOUNTER — Other Ambulatory Visit (HOSPITAL_COMMUNITY): Payer: Self-pay

## 2023-09-24 MED ORDER — ROSUVASTATIN CALCIUM 5 MG PO TABS
5.0000 mg | ORAL_TABLET | Freq: Every day | ORAL | 1 refills | Status: DC
  Filled 2023-09-24 (×2): qty 90, 90d supply, fill #0
  Filled 2023-12-28: qty 90, 90d supply, fill #1

## 2023-09-25 ENCOUNTER — Other Ambulatory Visit: Payer: Self-pay

## 2023-09-25 DIAGNOSIS — Z87891 Personal history of nicotine dependence: Secondary | ICD-10-CM

## 2023-09-25 DIAGNOSIS — Z122 Encounter for screening for malignant neoplasm of respiratory organs: Secondary | ICD-10-CM

## 2023-09-25 DIAGNOSIS — M25612 Stiffness of left shoulder, not elsewhere classified: Secondary | ICD-10-CM | POA: Diagnosis not present

## 2023-09-25 DIAGNOSIS — M19012 Primary osteoarthritis, left shoulder: Secondary | ICD-10-CM | POA: Diagnosis not present

## 2023-09-25 DIAGNOSIS — F17210 Nicotine dependence, cigarettes, uncomplicated: Secondary | ICD-10-CM

## 2023-09-25 DIAGNOSIS — M6281 Muscle weakness (generalized): Secondary | ICD-10-CM | POA: Diagnosis not present

## 2023-09-30 DIAGNOSIS — M19012 Primary osteoarthritis, left shoulder: Secondary | ICD-10-CM | POA: Diagnosis not present

## 2023-09-30 DIAGNOSIS — M6281 Muscle weakness (generalized): Secondary | ICD-10-CM | POA: Diagnosis not present

## 2023-09-30 DIAGNOSIS — M25612 Stiffness of left shoulder, not elsewhere classified: Secondary | ICD-10-CM | POA: Diagnosis not present

## 2023-10-02 DIAGNOSIS — M25612 Stiffness of left shoulder, not elsewhere classified: Secondary | ICD-10-CM | POA: Diagnosis not present

## 2023-10-02 DIAGNOSIS — M6281 Muscle weakness (generalized): Secondary | ICD-10-CM | POA: Diagnosis not present

## 2023-10-02 DIAGNOSIS — M19012 Primary osteoarthritis, left shoulder: Secondary | ICD-10-CM | POA: Diagnosis not present

## 2023-10-07 DIAGNOSIS — M25612 Stiffness of left shoulder, not elsewhere classified: Secondary | ICD-10-CM | POA: Diagnosis not present

## 2023-10-07 DIAGNOSIS — M6281 Muscle weakness (generalized): Secondary | ICD-10-CM | POA: Diagnosis not present

## 2023-10-07 DIAGNOSIS — M19012 Primary osteoarthritis, left shoulder: Secondary | ICD-10-CM | POA: Diagnosis not present

## 2023-10-09 DIAGNOSIS — F4321 Adjustment disorder with depressed mood: Secondary | ICD-10-CM | POA: Diagnosis not present

## 2023-10-09 DIAGNOSIS — M19012 Primary osteoarthritis, left shoulder: Secondary | ICD-10-CM | POA: Diagnosis not present

## 2023-10-09 DIAGNOSIS — M25612 Stiffness of left shoulder, not elsewhere classified: Secondary | ICD-10-CM | POA: Diagnosis not present

## 2023-10-09 DIAGNOSIS — E782 Mixed hyperlipidemia: Secondary | ICD-10-CM | POA: Diagnosis not present

## 2023-10-09 DIAGNOSIS — F331 Major depressive disorder, recurrent, moderate: Secondary | ICD-10-CM | POA: Diagnosis not present

## 2023-10-09 DIAGNOSIS — J439 Emphysema, unspecified: Secondary | ICD-10-CM | POA: Diagnosis not present

## 2023-10-09 DIAGNOSIS — M6281 Muscle weakness (generalized): Secondary | ICD-10-CM | POA: Diagnosis not present

## 2023-10-09 DIAGNOSIS — E119 Type 2 diabetes mellitus without complications: Secondary | ICD-10-CM | POA: Diagnosis not present

## 2023-10-09 DIAGNOSIS — I2510 Atherosclerotic heart disease of native coronary artery without angina pectoris: Secondary | ICD-10-CM | POA: Diagnosis not present

## 2023-10-14 DIAGNOSIS — M19012 Primary osteoarthritis, left shoulder: Secondary | ICD-10-CM | POA: Diagnosis not present

## 2023-10-14 DIAGNOSIS — M6281 Muscle weakness (generalized): Secondary | ICD-10-CM | POA: Diagnosis not present

## 2023-10-14 DIAGNOSIS — M25612 Stiffness of left shoulder, not elsewhere classified: Secondary | ICD-10-CM | POA: Diagnosis not present

## 2023-10-16 DIAGNOSIS — M25612 Stiffness of left shoulder, not elsewhere classified: Secondary | ICD-10-CM | POA: Diagnosis not present

## 2023-10-16 DIAGNOSIS — M19012 Primary osteoarthritis, left shoulder: Secondary | ICD-10-CM | POA: Diagnosis not present

## 2023-10-16 DIAGNOSIS — M6281 Muscle weakness (generalized): Secondary | ICD-10-CM | POA: Diagnosis not present

## 2023-10-21 DIAGNOSIS — M25612 Stiffness of left shoulder, not elsewhere classified: Secondary | ICD-10-CM | POA: Diagnosis not present

## 2023-10-21 DIAGNOSIS — M6281 Muscle weakness (generalized): Secondary | ICD-10-CM | POA: Diagnosis not present

## 2023-10-21 DIAGNOSIS — M19012 Primary osteoarthritis, left shoulder: Secondary | ICD-10-CM | POA: Diagnosis not present

## 2023-10-23 DIAGNOSIS — J439 Emphysema, unspecified: Secondary | ICD-10-CM | POA: Diagnosis not present

## 2023-10-23 DIAGNOSIS — M25612 Stiffness of left shoulder, not elsewhere classified: Secondary | ICD-10-CM | POA: Diagnosis not present

## 2023-10-23 DIAGNOSIS — E119 Type 2 diabetes mellitus without complications: Secondary | ICD-10-CM | POA: Diagnosis not present

## 2023-10-23 DIAGNOSIS — M19012 Primary osteoarthritis, left shoulder: Secondary | ICD-10-CM | POA: Diagnosis not present

## 2023-10-23 DIAGNOSIS — M6281 Muscle weakness (generalized): Secondary | ICD-10-CM | POA: Diagnosis not present

## 2023-10-23 DIAGNOSIS — I2510 Atherosclerotic heart disease of native coronary artery without angina pectoris: Secondary | ICD-10-CM | POA: Diagnosis not present

## 2023-10-24 ENCOUNTER — Other Ambulatory Visit (HOSPITAL_COMMUNITY): Payer: Self-pay

## 2023-10-28 DIAGNOSIS — M19012 Primary osteoarthritis, left shoulder: Secondary | ICD-10-CM | POA: Diagnosis not present

## 2023-10-28 DIAGNOSIS — M25612 Stiffness of left shoulder, not elsewhere classified: Secondary | ICD-10-CM | POA: Diagnosis not present

## 2023-10-28 DIAGNOSIS — M6281 Muscle weakness (generalized): Secondary | ICD-10-CM | POA: Diagnosis not present

## 2023-10-31 DIAGNOSIS — M25612 Stiffness of left shoulder, not elsewhere classified: Secondary | ICD-10-CM | POA: Diagnosis not present

## 2023-10-31 DIAGNOSIS — M6281 Muscle weakness (generalized): Secondary | ICD-10-CM | POA: Diagnosis not present

## 2023-10-31 DIAGNOSIS — M19012 Primary osteoarthritis, left shoulder: Secondary | ICD-10-CM | POA: Diagnosis not present

## 2023-11-04 DIAGNOSIS — M6281 Muscle weakness (generalized): Secondary | ICD-10-CM | POA: Diagnosis not present

## 2023-11-04 DIAGNOSIS — M25612 Stiffness of left shoulder, not elsewhere classified: Secondary | ICD-10-CM | POA: Diagnosis not present

## 2023-11-04 DIAGNOSIS — M19012 Primary osteoarthritis, left shoulder: Secondary | ICD-10-CM | POA: Diagnosis not present

## 2023-11-06 DIAGNOSIS — M6281 Muscle weakness (generalized): Secondary | ICD-10-CM | POA: Diagnosis not present

## 2023-11-06 DIAGNOSIS — M25612 Stiffness of left shoulder, not elsewhere classified: Secondary | ICD-10-CM | POA: Diagnosis not present

## 2023-11-06 DIAGNOSIS — M19012 Primary osteoarthritis, left shoulder: Secondary | ICD-10-CM | POA: Diagnosis not present

## 2023-11-09 DIAGNOSIS — E782 Mixed hyperlipidemia: Secondary | ICD-10-CM | POA: Diagnosis not present

## 2023-11-09 DIAGNOSIS — F4321 Adjustment disorder with depressed mood: Secondary | ICD-10-CM | POA: Diagnosis not present

## 2023-11-09 DIAGNOSIS — I2510 Atherosclerotic heart disease of native coronary artery without angina pectoris: Secondary | ICD-10-CM | POA: Diagnosis not present

## 2023-11-09 DIAGNOSIS — E119 Type 2 diabetes mellitus without complications: Secondary | ICD-10-CM | POA: Diagnosis not present

## 2023-11-09 DIAGNOSIS — J439 Emphysema, unspecified: Secondary | ICD-10-CM | POA: Diagnosis not present

## 2023-11-09 DIAGNOSIS — F331 Major depressive disorder, recurrent, moderate: Secondary | ICD-10-CM | POA: Diagnosis not present

## 2023-11-22 DIAGNOSIS — I2510 Atherosclerotic heart disease of native coronary artery without angina pectoris: Secondary | ICD-10-CM | POA: Diagnosis not present

## 2023-11-22 DIAGNOSIS — E119 Type 2 diabetes mellitus without complications: Secondary | ICD-10-CM | POA: Diagnosis not present

## 2023-11-22 DIAGNOSIS — J439 Emphysema, unspecified: Secondary | ICD-10-CM | POA: Diagnosis not present

## 2023-12-09 DIAGNOSIS — E782 Mixed hyperlipidemia: Secondary | ICD-10-CM | POA: Diagnosis not present

## 2023-12-09 DIAGNOSIS — I2510 Atherosclerotic heart disease of native coronary artery without angina pectoris: Secondary | ICD-10-CM | POA: Diagnosis not present

## 2023-12-09 DIAGNOSIS — J439 Emphysema, unspecified: Secondary | ICD-10-CM | POA: Diagnosis not present

## 2023-12-09 DIAGNOSIS — F331 Major depressive disorder, recurrent, moderate: Secondary | ICD-10-CM | POA: Diagnosis not present

## 2023-12-09 DIAGNOSIS — F4321 Adjustment disorder with depressed mood: Secondary | ICD-10-CM | POA: Diagnosis not present

## 2023-12-09 DIAGNOSIS — E119 Type 2 diabetes mellitus without complications: Secondary | ICD-10-CM | POA: Diagnosis not present

## 2023-12-16 DIAGNOSIS — Z23 Encounter for immunization: Secondary | ICD-10-CM | POA: Diagnosis not present

## 2023-12-16 DIAGNOSIS — Z1211 Encounter for screening for malignant neoplasm of colon: Secondary | ICD-10-CM | POA: Diagnosis not present

## 2023-12-16 DIAGNOSIS — M533 Sacrococcygeal disorders, not elsewhere classified: Secondary | ICD-10-CM | POA: Diagnosis not present

## 2023-12-16 DIAGNOSIS — I2510 Atherosclerotic heart disease of native coronary artery without angina pectoris: Secondary | ICD-10-CM | POA: Diagnosis not present

## 2023-12-16 DIAGNOSIS — M25551 Pain in right hip: Secondary | ICD-10-CM | POA: Diagnosis not present

## 2023-12-16 DIAGNOSIS — F17210 Nicotine dependence, cigarettes, uncomplicated: Secondary | ICD-10-CM | POA: Diagnosis not present

## 2023-12-16 DIAGNOSIS — Z9181 History of falling: Secondary | ICD-10-CM | POA: Diagnosis not present

## 2023-12-16 DIAGNOSIS — F331 Major depressive disorder, recurrent, moderate: Secondary | ICD-10-CM | POA: Diagnosis not present

## 2023-12-16 DIAGNOSIS — Z0000 Encounter for general adult medical examination without abnormal findings: Secondary | ICD-10-CM | POA: Diagnosis not present

## 2023-12-16 DIAGNOSIS — E119 Type 2 diabetes mellitus without complications: Secondary | ICD-10-CM | POA: Diagnosis not present

## 2023-12-16 DIAGNOSIS — E782 Mixed hyperlipidemia: Secondary | ICD-10-CM | POA: Diagnosis not present

## 2023-12-19 ENCOUNTER — Other Ambulatory Visit (HOSPITAL_COMMUNITY): Payer: Self-pay

## 2023-12-22 DIAGNOSIS — I2510 Atherosclerotic heart disease of native coronary artery without angina pectoris: Secondary | ICD-10-CM | POA: Diagnosis not present

## 2023-12-22 DIAGNOSIS — J439 Emphysema, unspecified: Secondary | ICD-10-CM | POA: Diagnosis not present

## 2023-12-22 DIAGNOSIS — D72829 Elevated white blood cell count, unspecified: Secondary | ICD-10-CM | POA: Diagnosis not present

## 2023-12-22 DIAGNOSIS — E871 Hypo-osmolality and hyponatremia: Secondary | ICD-10-CM | POA: Diagnosis not present

## 2023-12-22 DIAGNOSIS — E119 Type 2 diabetes mellitus without complications: Secondary | ICD-10-CM | POA: Diagnosis not present

## 2023-12-24 DIAGNOSIS — Z1211 Encounter for screening for malignant neoplasm of colon: Secondary | ICD-10-CM | POA: Diagnosis not present

## 2023-12-29 ENCOUNTER — Other Ambulatory Visit (HOSPITAL_COMMUNITY): Payer: Self-pay

## 2024-01-09 ENCOUNTER — Other Ambulatory Visit: Payer: Self-pay | Admitting: Medical Genetics

## 2024-01-09 DIAGNOSIS — I2510 Atherosclerotic heart disease of native coronary artery without angina pectoris: Secondary | ICD-10-CM | POA: Diagnosis not present

## 2024-01-09 DIAGNOSIS — E782 Mixed hyperlipidemia: Secondary | ICD-10-CM | POA: Diagnosis not present

## 2024-01-09 DIAGNOSIS — Z006 Encounter for examination for normal comparison and control in clinical research program: Secondary | ICD-10-CM

## 2024-01-09 DIAGNOSIS — F331 Major depressive disorder, recurrent, moderate: Secondary | ICD-10-CM | POA: Diagnosis not present

## 2024-01-09 DIAGNOSIS — J439 Emphysema, unspecified: Secondary | ICD-10-CM | POA: Diagnosis not present

## 2024-01-09 DIAGNOSIS — E119 Type 2 diabetes mellitus without complications: Secondary | ICD-10-CM | POA: Diagnosis not present

## 2024-01-09 DIAGNOSIS — F4321 Adjustment disorder with depressed mood: Secondary | ICD-10-CM | POA: Diagnosis not present

## 2024-01-18 ENCOUNTER — Other Ambulatory Visit (HOSPITAL_COMMUNITY): Payer: Self-pay

## 2024-01-20 ENCOUNTER — Other Ambulatory Visit: Payer: Self-pay

## 2024-01-20 ENCOUNTER — Other Ambulatory Visit (HOSPITAL_COMMUNITY): Payer: Self-pay

## 2024-01-20 MED ORDER — METFORMIN HCL ER 500 MG PO TB24
500.0000 mg | ORAL_TABLET | Freq: Two times a day (BID) | ORAL | 3 refills | Status: AC
  Filled 2024-01-20: qty 180, 90d supply, fill #0

## 2024-01-21 DIAGNOSIS — J439 Emphysema, unspecified: Secondary | ICD-10-CM | POA: Diagnosis not present

## 2024-01-21 DIAGNOSIS — E119 Type 2 diabetes mellitus without complications: Secondary | ICD-10-CM | POA: Diagnosis not present

## 2024-01-21 DIAGNOSIS — I2510 Atherosclerotic heart disease of native coronary artery without angina pectoris: Secondary | ICD-10-CM | POA: Diagnosis not present

## 2024-01-28 ENCOUNTER — Other Ambulatory Visit: Payer: Self-pay | Admitting: Family Medicine

## 2024-01-28 DIAGNOSIS — Z1231 Encounter for screening mammogram for malignant neoplasm of breast: Secondary | ICD-10-CM

## 2024-02-25 ENCOUNTER — Inpatient Hospital Stay: Admission: RE | Admit: 2024-02-25 | Discharge: 2024-02-25 | Attending: Family Medicine

## 2024-02-25 DIAGNOSIS — Z1231 Encounter for screening mammogram for malignant neoplasm of breast: Secondary | ICD-10-CM

## 2024-03-16 ENCOUNTER — Other Ambulatory Visit (HOSPITAL_COMMUNITY): Payer: Self-pay

## 2024-03-21 ENCOUNTER — Other Ambulatory Visit (HOSPITAL_COMMUNITY): Payer: Self-pay

## 2024-03-22 ENCOUNTER — Other Ambulatory Visit: Payer: Self-pay

## 2024-03-22 ENCOUNTER — Other Ambulatory Visit (HOSPITAL_BASED_OUTPATIENT_CLINIC_OR_DEPARTMENT_OTHER): Payer: Self-pay

## 2024-03-22 ENCOUNTER — Other Ambulatory Visit (HOSPITAL_COMMUNITY): Payer: Self-pay

## 2024-03-22 MED ORDER — ROSUVASTATIN CALCIUM 5 MG PO TABS
5.0000 mg | ORAL_TABLET | Freq: Every day | ORAL | 3 refills | Status: AC
  Filled 2024-03-22: qty 90, 90d supply, fill #0

## 2024-03-23 ENCOUNTER — Telehealth: Payer: Self-pay

## 2024-03-23 ENCOUNTER — Telehealth (HOSPITAL_BASED_OUTPATIENT_CLINIC_OR_DEPARTMENT_OTHER): Payer: Self-pay | Admitting: *Deleted

## 2024-03-23 NOTE — Telephone Encounter (Signed)
 Med req and consent are complete. Call patient at [phone].

## 2024-03-23 NOTE — Telephone Encounter (Signed)
" ° °  Name: Melissa Jimenez  DOB: Aug 01, 1950  MRN: [id number]  Primary Cardiologist: Madonna Large, DO  Preoperative team, please contact this patient and set up a phone call appointment for further preoperative risk assessment. Please obtain consent and complete medication review. Thank you for your help.  I confirm that guidance regarding antiplatelet and oral anticoagulation therapy has been completed and, if necessary, noted below.  Regarding ASA therapy, we recommend continuation of ASA throughout the perioperative period.  However, if the surgeon feels that cessation of ASA is required in the perioperative period, it may be stopped 5-7 days prior to surgery with a plan to resume it as soon as felt to be feasible from a surgical standpoint in the post-operative period.  I also confirmed the patient resides in the state of [place] . As per [facility] Medical Board telemedicine laws, the patient must reside in the state in which the provider is licensed.   Saddie GORMAN Cleaves, NP 03/23/2024, 4:44 PM [facility]    "

## 2024-03-23 NOTE — Telephone Encounter (Signed)
"  ° °  Pre-operative Risk Assessment    Patient Name: Melissa Jimenez  DOB: 07-01-1950 MRN: [id number]   Date of last office visit: 07/01/2023 Date of next office visit: None  Request for Surgical Clearance    Procedure:  Right Reverse total shoulder arthroplasty  Date of Surgery:  Clearance TBD                                 Surgeon:  Dr.Dax Cristy Socks Group or Practice Name:  [facility] Phone number:  [phone] Fax number:  [phone]   Type of Clearance Requested:   - Medical  - Pharmacy:  Hold Aspirin  Not indicated.   Type of Anesthesia:  General Interscalene block   Additional requests/questions:    Signed, Edsel Grayce Sanders   03/23/2024, 4:36 PM   "

## 2024-03-23 NOTE — Telephone Encounter (Signed)
"  °  Patient Consent for Virtual Visit         Melissa Jimenez has provided verbal consent on 03/23/2024 for a virtual visit (video or telephone).  Med req and consent are complete. Call patient at [phone]. Appointment is scheduled for 03/30/24 at 2:40.   CONSENT FOR VIRTUAL VISIT FOR:  Melissa Jimenez  By participating in this virtual visit I agree to the following:  I hereby voluntarily request, consent and authorize [facility] and its employed or contracted physicians, physician assistants, nurse practitioners or other licensed health care professionals (the Practitioner), to provide me with telemedicine health care services (the Services) as deemed necessary by the treating Practitioner. I acknowledge and consent to receive the Services by the Practitioner via telemedicine. I understand that the telemedicine visit will involve communicating with the Practitioner through live audiovisual communication technology and the disclosure of certain medical information by electronic transmission. I acknowledge that I have been given the opportunity to request an in-person assessment or other available alternative prior to the telemedicine visit and am voluntarily participating in the telemedicine visit.  I understand that I have the right to withhold or withdraw my consent to the use of telemedicine in the course of my care at any time, without affecting my right to future care or treatment, and that the Practitioner or I may terminate the telemedicine visit at any time. I understand that I have the right to inspect all information obtained and/or recorded in the course of the telemedicine visit and may receive copies of available information for a reasonable fee.  I understand that some of the potential risks of receiving the Services via telemedicine include:  Delay or interruption in medical evaluation due to technological equipment failure or disruption; Information  transmitted may not be sufficient (e.g. poor resolution of images) to allow for appropriate medical decision making by the Practitioner; and/or  In rare instances, security protocols could fail, causing a breach of personal health information.  Furthermore, I acknowledge that it is my responsibility to provide information about my medical history, conditions and care that is complete and accurate to the best of my ability. I acknowledge that Practitioner's advice, recommendations, and/or decision may be based on factors not within their control, such as incomplete or inaccurate data provided by me or distortions of diagnostic images or specimens that may result from electronic transmissions. I understand that the practice of medicine is not an exact science and that Practitioner makes no warranties or guarantees regarding treatment outcomes. I acknowledge that a copy of this consent can be made available to me via my patient portal [facility] MyChart), or I can request a printed copy by calling the office of [facility].    I understand that my insurance will be billed for this visit.   I have read or had this consent read to me. I understand the contents of this consent, which adequately explains the benefits and risks of the Services being provided via telemedicine.  I have been provided ample opportunity to ask questions regarding this consent and the Services and have had my questions answered to my satisfaction. I give my informed consent for the services to be provided through the use of telemedicine in my medical care    "

## 2024-03-26 ENCOUNTER — Other Ambulatory Visit: Payer: Self-pay | Admitting: Orthopaedic Surgery

## 2024-03-26 DIAGNOSIS — M19011 Primary osteoarthritis, right shoulder: Secondary | ICD-10-CM

## 2024-03-30 ENCOUNTER — Ambulatory Visit: Attending: Cardiology | Admitting: Emergency Medicine

## 2024-03-30 DIAGNOSIS — Z01810 Encounter for preprocedural cardiovascular examination: Secondary | ICD-10-CM | POA: Diagnosis not present

## 2024-03-30 NOTE — Progress Notes (Signed)
 "   Virtual Visit via Telephone Note   Because of Melissa Jimenez co-morbid illnesses, she is at least at moderate risk for complications without adequate follow up.  This format is felt to be most appropriate for this patient at this time.  Due to technical limitations with video connection (technology), today's appointment will be conducted as an audio only telehealth visit, and Melissa Jimenez verbally agreed to proceed in this manner.   All issues noted in this document were discussed and addressed.  No physical exam could be performed with this format.  Evaluation Performed:  Preoperative cardiovascular risk assessment _____________   Date:  03/30/2024   Patient ID:  Melissa Jimenez, DOB June 25, 1950, MRN [id number] Patient Location:  Home Provider location:   Office  Primary Care Provider:  Chrystal Lamarr RAMAN, MD Primary Cardiologist:  Madonna Large, DO  Chief Complaint / Patient Profile   74 y.o. y/o female with a h/o coronary artery calcification, aortic atherosclerosis, tobacco use who is pending right reverse total shoulder arthroplasty on date TBD with [facility] and presents today for telephonic preoperative cardiovascular risk assessment.  History of Present Illness    Melissa Jimenez is a 74 y.o. female who presents via audio/video conferencing for a telehealth visit today.  Pt was last seen in cardiology clinic on 07/01/2023 by Dr. Large.  At that time Melissa Jimenez was doing well.  The patient is now pending procedure as outlined above. Since her last visit, she is doing well without acute cardiovascular concerns or complaints.  Had surgery on her left shoulder last year and had no cardiovascular complications.  She remains fairly active without exertional symptoms.  No chest pains, dyspnea, orthopnea, syncope, presyncope, palpitations, or dizziness.  She is able to complete greater than 4 METS.  She is without symptoms concerning for  angina.  Past Medical History    Past Medical History:  Diagnosis Date   Anxiety    Arthritis    Diabetes mellitus without complication (HCC)    Past Surgical History:  Procedure Laterality Date   CATARACT EXTRACTION     THYROID  LOBECTOMY Left 03/28/2022   Procedure: LEFT THYROID  LOBECTOMY;  Surgeon: Eletha Boas, MD;  Location: [facility];  Service: General;  Laterality: Left;   WISDOM TOOTH EXTRACTION      Allergies  Allergies[1]  Home Medications    Prior to Admission medications  Medication Sig Start Date End Date Taking? Authorizing Provider  acetaminophen  (TYLENOL ) 500 MG tablet Take 2 tablets (1,000 mg total) by mouth every 8 (eight) hours. 07/16/23     aspirin  81 MG chewable tablet Chew 1 tablet (81 mg total) by mouth 2 (two) times daily for 6 weeks post op for DVT prophylaxis 07/16/23     aspirin  EC 81 MG tablet Take 1 tablet (81 mg total) by mouth daily. Swallow whole. 01/02/23   Tolia, Sunit, DO  Blood Glucose Monitoring Suppl (ONETOUCH VERIO FLEX SYSTEM) w/Device KIT Use to check blood sugar 2 times a day 06/05/21     buPROPion  (WELLBUTRIN  XL) 300 MG 24 hr tablet Take 1 tablet (300 mg total) by mouth in the morning. 06/25/23     Calcium  Carb-Cholecalciferol (CALCIUM  + D3 PO) Take 1 tablet by mouth every evening.    [provider]  carboxymethylcellulose (REFRESH TEARS) 0.5 % SOLN Place 1 drop into both eyes in the morning.    [provider]  celecoxib  (CELEBREX ) 100 MG capsule Take 1 capsule (100 mg total) by mouth  2 (two) times daily. 07/16/23     glucose blood test strip Use to check blood sugar daily 03/21/22     HYDROcodone -acetaminophen  (NORCO/VICODIN) 5-325 MG tablet Take 1 tablet by mouth every six to eight hours as needed for pain 04/08/22     ibuprofen (ADVIL) 200 MG tablet Take 600 mg by mouth 2 (two) times daily as needed (pain.).    [provider]  Lancets [facility] DELICA PLUS Onycha) MISC Use daily as directed 03/21/22     meloxicam   (MOBIC ) 15 MG tablet Take 1 tablet (15 mg total) by mouth daily as needed for pain. 02/28/23     metFORMIN  (GLUCOPHAGE -XR) 500 MG 24 hr tablet Take 1 tablet (500 mg total) by mouth 2 (two) times daily with a meal. 01/20/24     methylPREDNISolone  (MEDROL ) 4 MG TBPK tablet Take 6 tablets by mouth on day 1 then decrease by 1 tablet daily as directed on package 07/31/23     ondansetron  (ZOFRAN -ODT) 4 MG disintegrating tablet Take 1 tablet (4 mg total) by mouth every 8 (eight) hours as needed for nausea/vomiting after surgery 07/16/23     ondansetron  (ZOFRAN -ODT) 4 MG disintegrating tablet Dissolve 1 tablet by mouth every eight hours as needed for nausea/vomiting after surgery. 07/22/23     oxyCODONE  (OXY IR/ROXICODONE ) 5 MG immediate release tablet Take 1-2 tablets (5-10 mg total) by mouth every 6 (six) hours as needed for pain.  No more than 6 tablets daily. 07/16/23     rosuvastatin  (CRESTOR ) 5 MG tablet Take 1 tablet (5 mg total) by mouth at bedtime. 03/22/24     traMADol  (ULTRAM ) 50 MG tablet Take 1 - 2 tablets (50 - 100 mg total) by mouth every 6 (six) hours as needed for moderate pain. 03/29/22   Eletha Boas, MD    Physical Exam    Vital Signs:  Melissa Jimenez does not have vital signs available for review today.  Given telephonic nature of communication, physical exam is limited. AAOx3. NAD. Normal affect.  Speech and respirations are unlabored.  Accessory Clinical Findings    None  Assessment & Plan    1.  Preoperative Cardiovascular Risk Assessment: According to the Revised Cardiac Risk Index (RCRI), her Perioperative Risk of Major Cardiac Event is (%): 0.4. Her Functional Capacity in METs is: 5.62 according to the [facility] Activity Status Index (DASI).  Therefore, based on ACC/AHA guidelines, patient would be at acceptable risk for the planned procedure without further cardiovascular testing. I will route this recommendation to the requesting party via Epic fax function.  The patient  was advised that if she develops new symptoms prior to surgery to contact our office to arrange for a follow-up visit, and she verbalized understanding.  Regarding ASA therapy, we recommend continuation of ASA throughout the perioperative period.  However, if the surgeon feels that cessation of ASA is required in the perioperative period, it may be stopped 5-7 days prior to surgery with a plan to resume it as soon as felt to be feasible from a surgical standpoint in the post-operative period.    A copy of this note will be routed to requesting surgeon.  Time:   Today, I have spent 6 minutes with the patient with telehealth technology discussing medical history, symptoms, and management plan.     Melissa LITTIE Louis, NP  03/30/2024, 2:45 PM     [1] Not on File  "

## 2024-04-06 ENCOUNTER — Other Ambulatory Visit

## 2024-04-07 ENCOUNTER — Other Ambulatory Visit

## 2024-04-16 ENCOUNTER — Ambulatory Visit: Admission: RE | Admit: 2024-04-16 | Source: Ambulatory Visit

## 2024-04-16 DIAGNOSIS — M19011 Primary osteoarthritis, right shoulder: Secondary | ICD-10-CM
